# Patient Record
Sex: Male | Born: 1975 | Race: White | Hispanic: No | Marital: Married | Smoking: Never smoker
Health system: Southern US, Community
[De-identification: ages and names within clinical notes are randomized; demographics above are authoritative.]

## PROBLEM LIST (undated history)

## (undated) DIAGNOSIS — B001 Herpesviral vesicular dermatitis: Secondary | ICD-10-CM

## (undated) DIAGNOSIS — K219 Gastro-esophageal reflux disease without esophagitis: Secondary | ICD-10-CM

## (undated) DIAGNOSIS — T7840XA Allergy, unspecified, initial encounter: Secondary | ICD-10-CM

## (undated) DIAGNOSIS — K4090 Unilateral inguinal hernia, without obstruction or gangrene, not specified as recurrent: Secondary | ICD-10-CM

## (undated) DIAGNOSIS — J189 Pneumonia, unspecified organism: Secondary | ICD-10-CM

## (undated) DIAGNOSIS — C801 Malignant (primary) neoplasm, unspecified: Secondary | ICD-10-CM

## (undated) HISTORY — DX: Herpesviral vesicular dermatitis: B00.1

## (undated) HISTORY — DX: Allergy, unspecified, initial encounter: T78.40XA

---

## 2005-03-01 ENCOUNTER — Emergency Department: Payer: Self-pay | Admitting: Emergency Medicine

## 2006-04-06 HISTORY — PX: COLONOSCOPY: SHX174

## 2006-11-16 ENCOUNTER — Ambulatory Visit: Payer: Self-pay | Admitting: Family Medicine

## 2006-12-15 ENCOUNTER — Ambulatory Visit: Payer: Self-pay | Admitting: Gastroenterology

## 2008-05-03 IMAGING — CR DG CHEST 2V
1 series · 2 of 2 positions shown · non-contrast
Comparison: none

REASON FOR EXAM: Shortness of breath, pneumonia
COMMENTS:

[Series 1: view not recorded · 0.17mm/px · 2 of 2 slices shown]
[im 1/2]
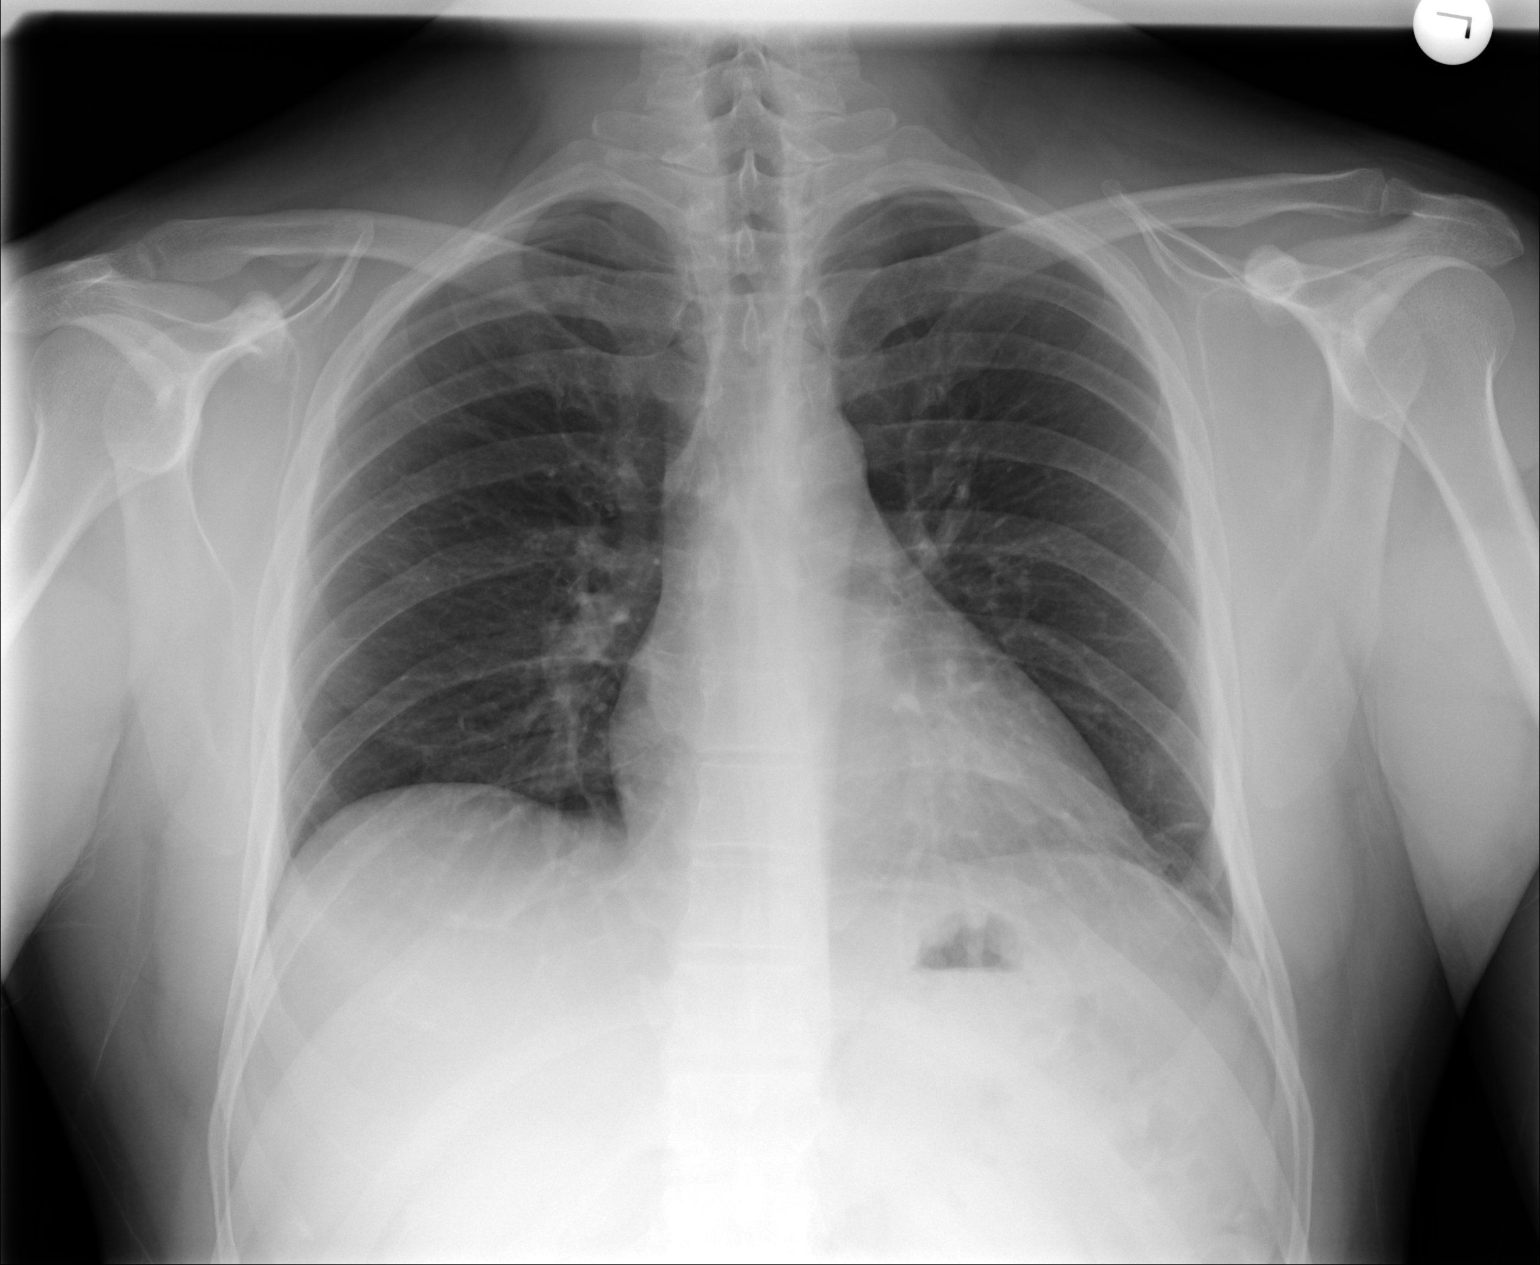
[im 2/2]
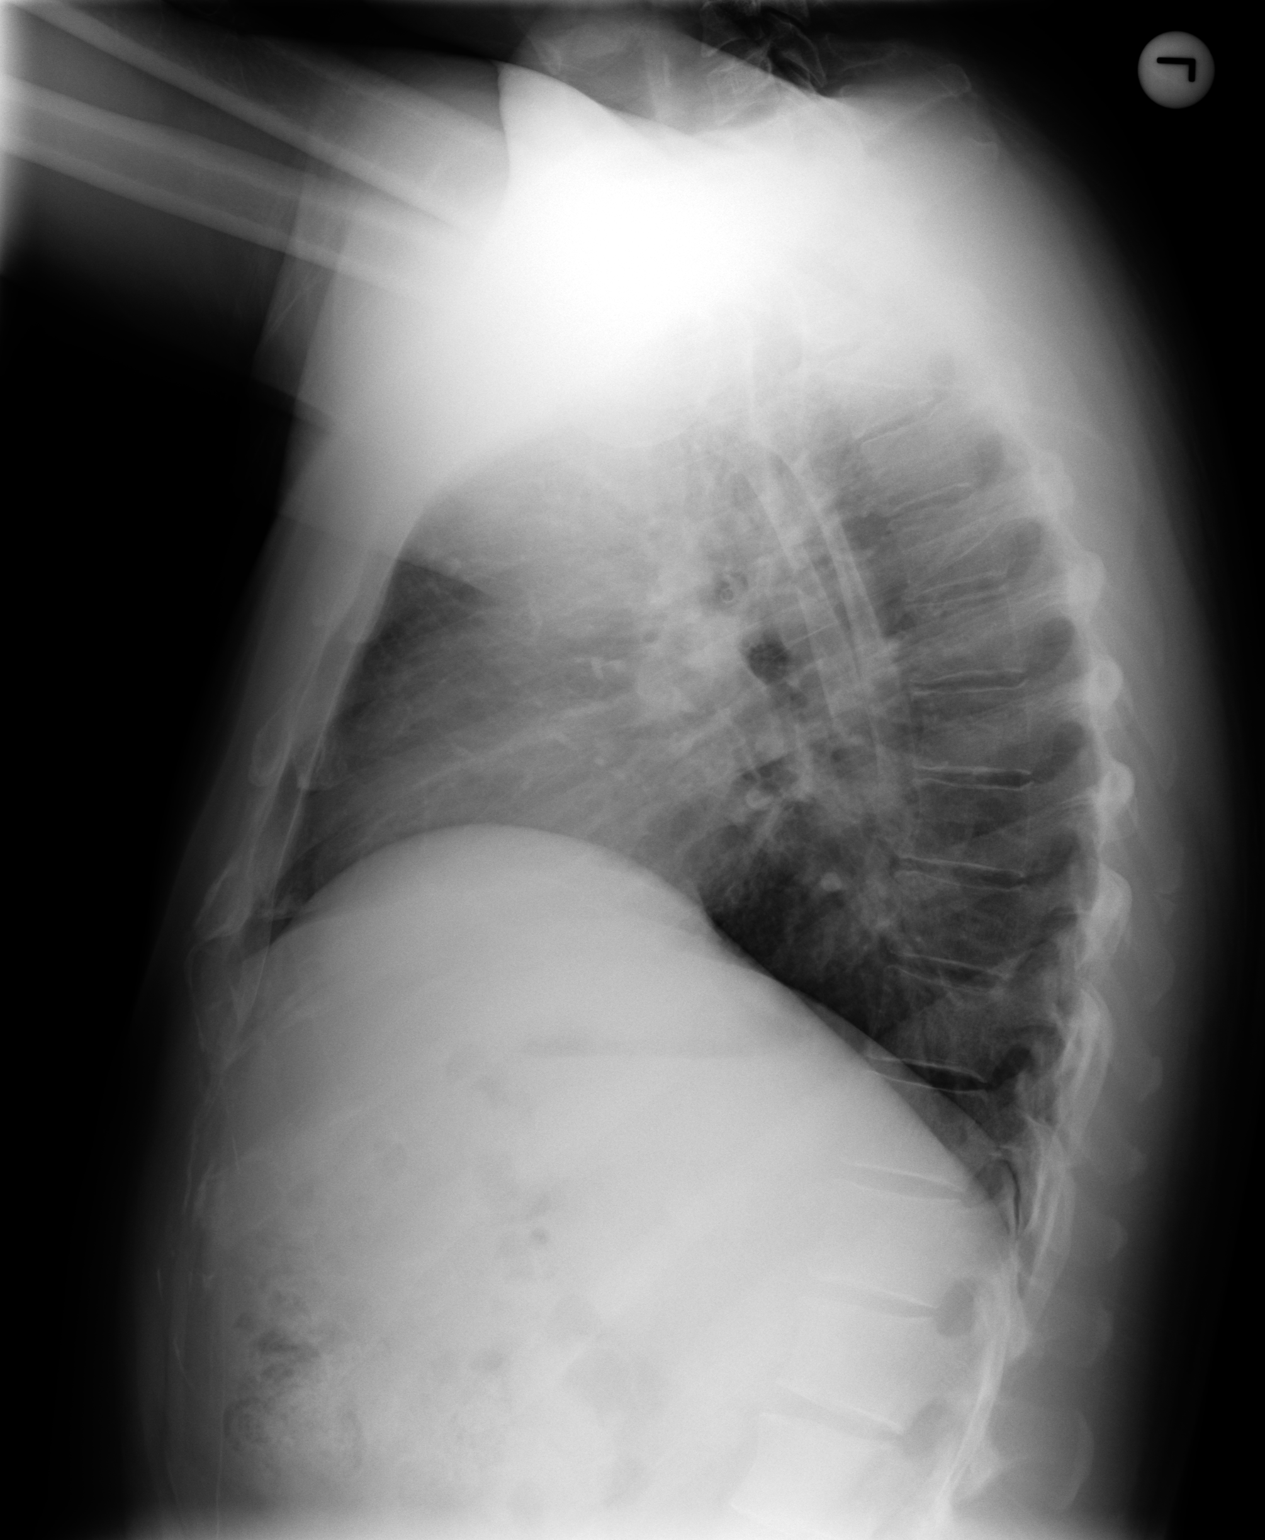

[2 of 2 positions shown; findings below may reference images not displayed]

PROCEDURE:     MDR - MDR CHEST PA(OR AP) AND LATERAL  - November 16, 2006  [DATE]

RESULT:     Comparison is made to a prior exam of 03/01/2005.

There is a linear density at the LEFT base just above the costophrenic
angle. The density is consistent with a fibrotic strand. The lung fields are
clear of infiltrate. The RIGHT lower lobe pneumonia, present on the prior
exam of 03/01/2005, has cleared with no residual evident. The heart,
mediastinal and osseous structures are normal in appearance.
IMPRESSION: No significant abnormalities are noted.

## 2012-03-22 ENCOUNTER — Ambulatory Visit: Payer: Self-pay | Admitting: Family Medicine

## 2013-09-07 IMAGING — CR DG CHEST 2V
1 series · 3 of 3 positions shown · non-contrast
Comparison: none

REASON FOR EXAM: congestion; hx pneumonia
COMMENTS:

[Series 1: pa · 0.17mm/px · 3 of 3 slices shown]
[im 1/3]
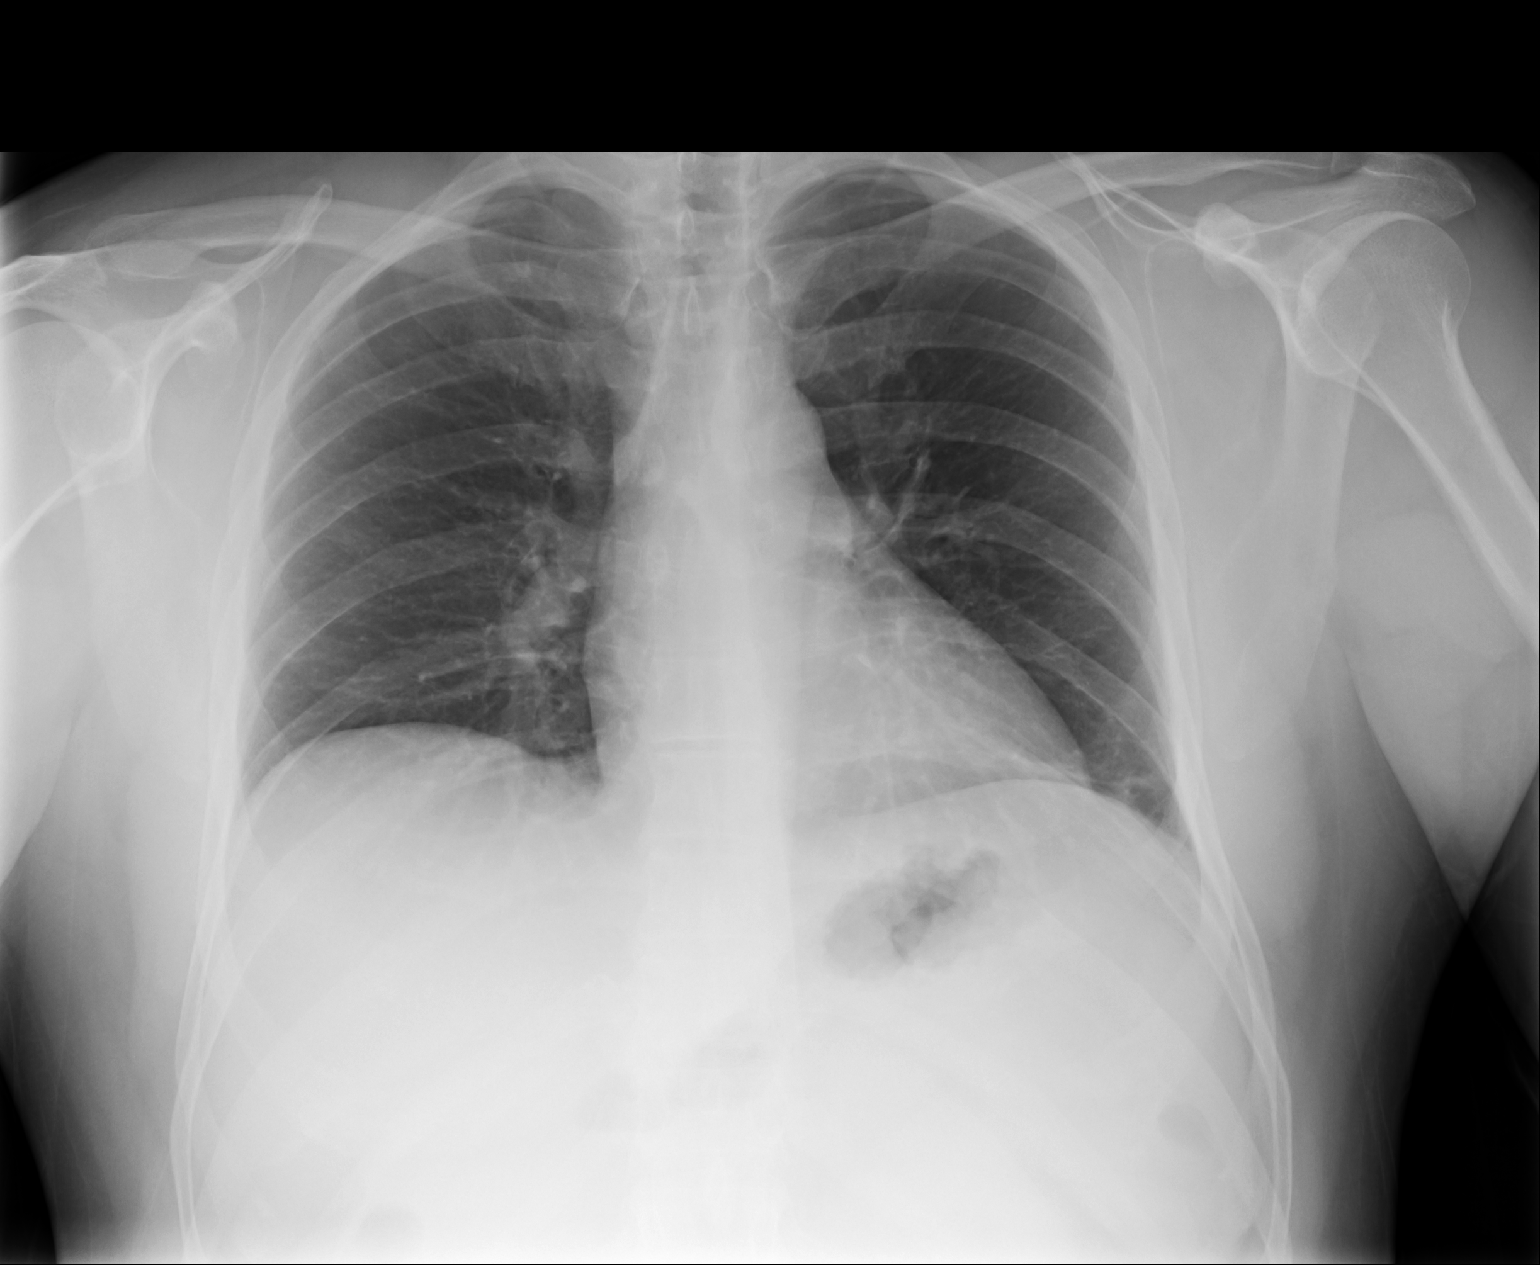
[im 2/3]
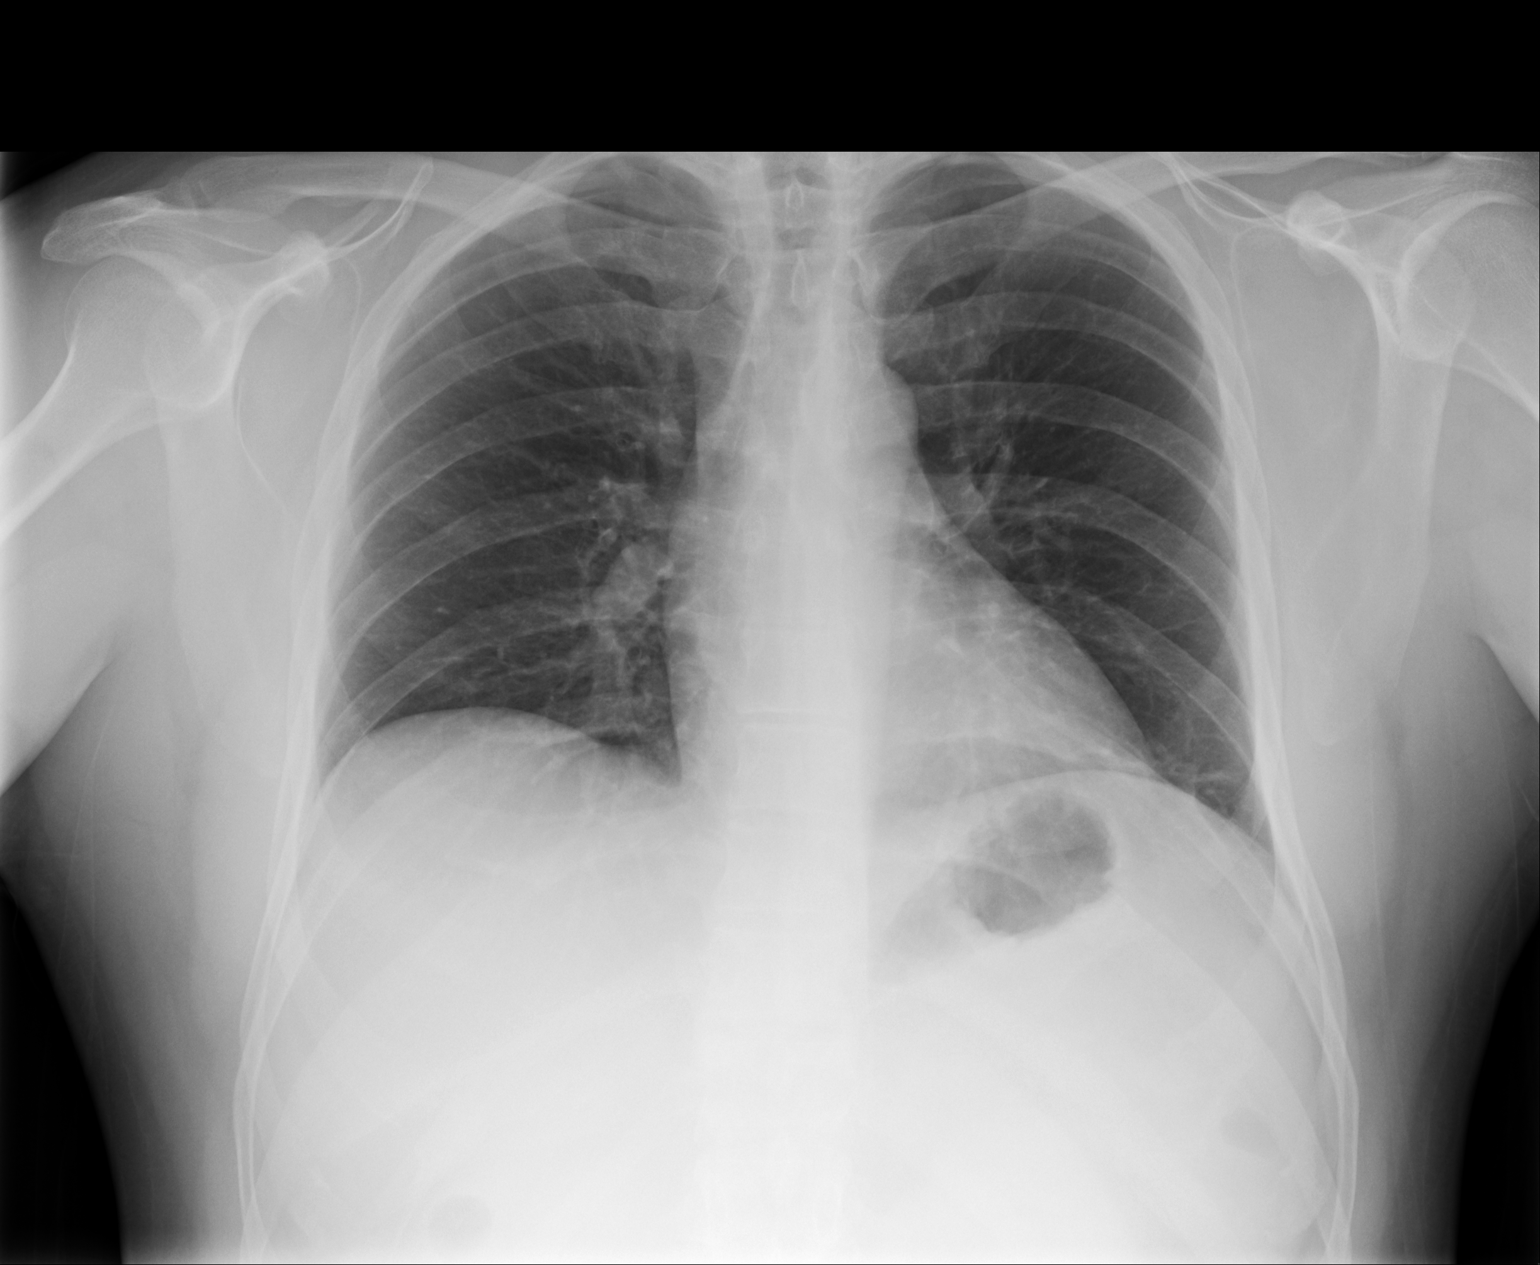
[im 3/3]
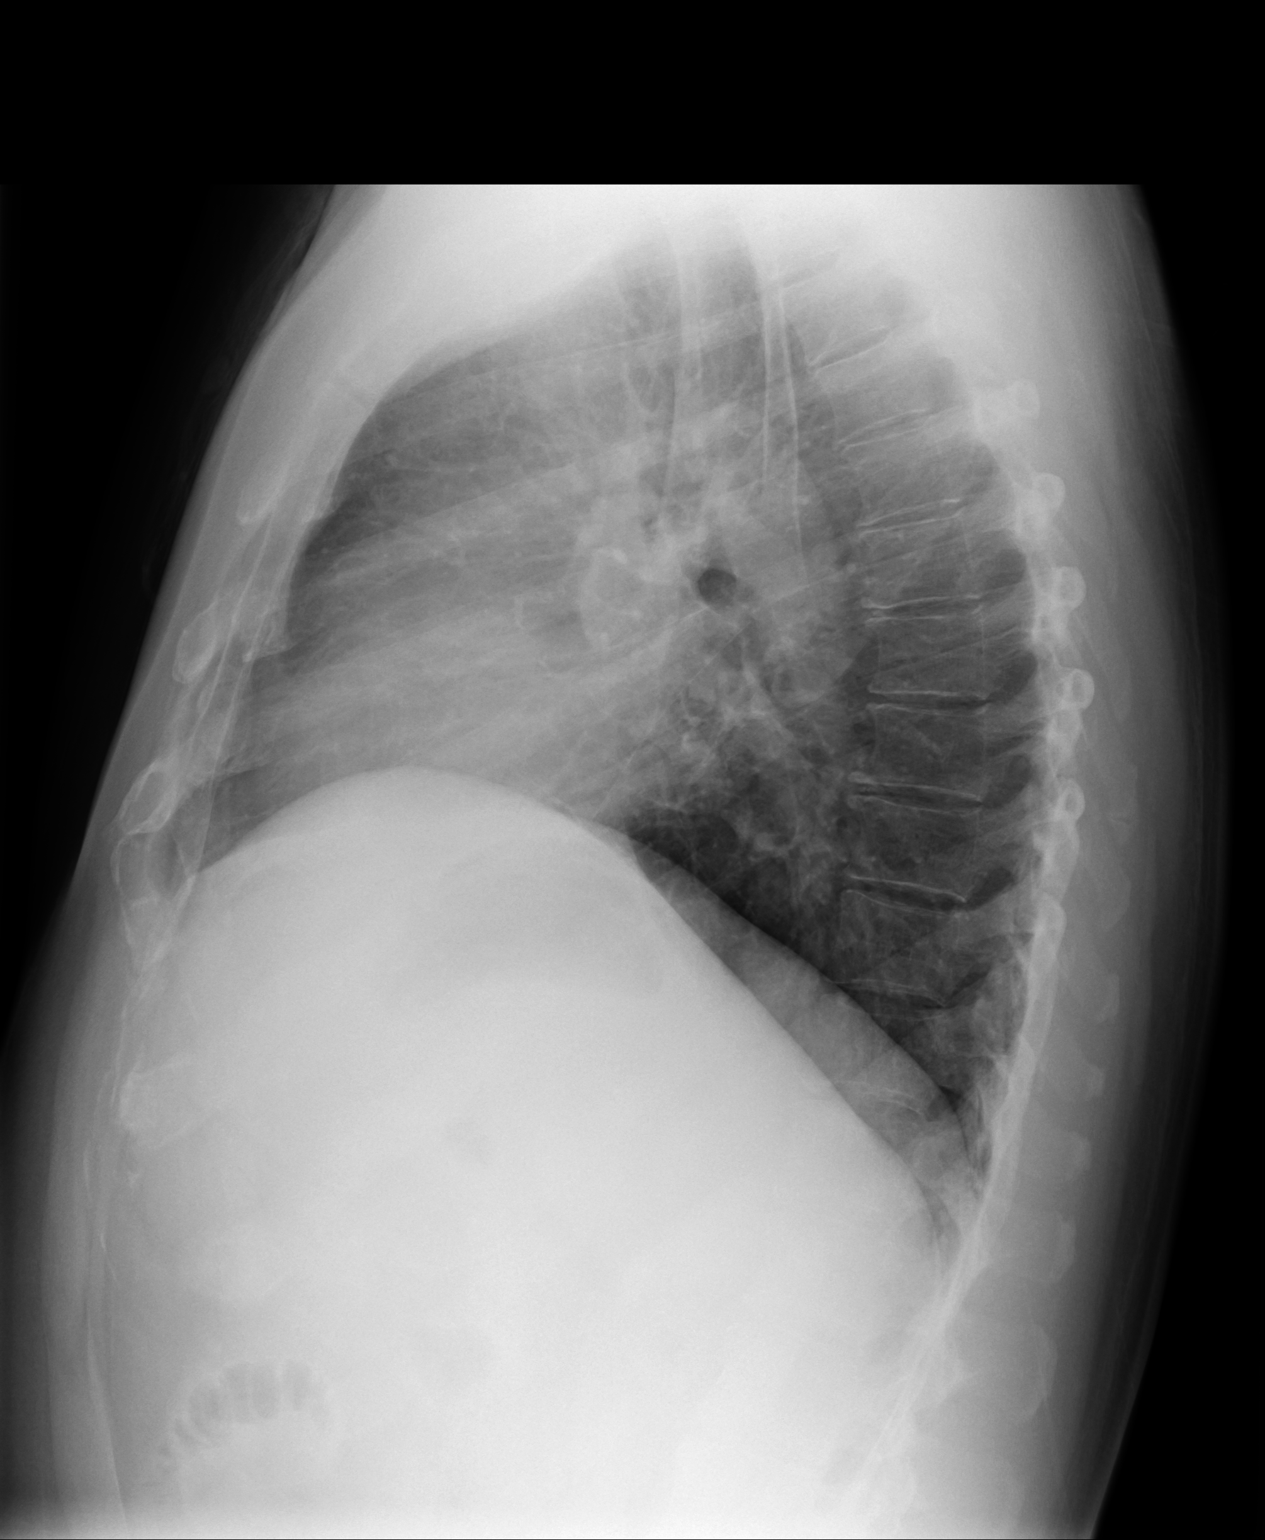

[3 of 3 positions shown; findings below may reference images not displayed]

PROCEDURE:     MDR - MDR CHEST PA(OR AP) AND LATERAL  - March 22, 2012  [DATE]

RESULT:     There is hypoinflation with minimal linear atelectasis minimal
left costophrenic angle. The lungs are otherwise clear. Cardiac silhouette
and pulmonary vasculature appear to be normal. The bony and mediastinal
structures are unremarkable.
IMPRESSION: 1. Hypoinflation. No acute cardiopulmonary disease evident.

[REDACTED]

## 2013-09-09 ENCOUNTER — Ambulatory Visit: Payer: Self-pay | Admitting: Family Medicine

## 2014-11-08 ENCOUNTER — Ambulatory Visit (INDEPENDENT_AMBULATORY_CARE_PROVIDER_SITE_OTHER): Payer: No Typology Code available for payment source | Admitting: Family Medicine

## 2014-11-08 ENCOUNTER — Encounter: Payer: Self-pay | Admitting: Family Medicine

## 2014-11-08 VITALS — BP 110/62 | HR 60 | Ht 68.0 in | Wt 194.0 lb

## 2014-11-08 DIAGNOSIS — J0100 Acute maxillary sinusitis, unspecified: Secondary | ICD-10-CM

## 2014-11-08 MED ORDER — AMOXICILLIN 500 MG PO CAPS: 500.0000 mg | ORAL_CAPSULE | Freq: Three times a day (TID) | ORAL | Status: DC

## 2014-11-08 NOTE — Progress Notes (Signed)
Name: Dustin Morse.   MRN: [id number]    DOB: 1975-05-29   Date:11/08/2014       Progress Note  Subjective  Chief Complaint  Chief Complaint  Patient presents with  . Sinusitis    Sinusitis This is a recurrent problem. The current episode started yesterday. The problem has been waxing and waning since onset. There has been no fever. The fever has been present for 1 to 2 days. Associated symptoms include congestion, sinus pressure and a sore throat. Pertinent negatives include no chills, coughing, diaphoresis, ear pain, headaches, hoarse voice, neck pain, shortness of breath, sneezing or swollen glands. Past treatments include nothing. The treatment provided no relief.    No problem-specific assessment & plan notes found for this encounter.   History reviewed. No pertinent past medical history.  Past Surgical History  Procedure Laterality Date  . Colonoscopy  2008    cleared for 10 yrs- Dr Servando Snare    History reviewed. No pertinent family history.  History   Social History  . Marital Status: Married    Spouse Name: N/A  . Number of Children: N/A  . Years of Education: N/A   Occupational History  . Not on file.   Social History Main Topics  . Smoking status: Never Smoker   . Smokeless tobacco: Not on file  . Alcohol Use: 0.0 oz/week    0 Standard drinks or equivalent per week  . Drug Use: No  . Sexual Activity: Yes   Other Topics Concern  . Not on file   Social History Narrative  . No narrative on file    No Known Allergies   Review of Systems  Constitutional: Negative for fever, chills, weight loss, malaise/fatigue and diaphoresis.  HENT: Positive for congestion, sinus pressure and sore throat. Negative for ear discharge, ear pain, hoarse voice and sneezing.   Eyes: Negative for blurred vision.  Respiratory: Negative for cough, sputum production, shortness of breath and wheezing.   Cardiovascular: Negative for chest pain, palpitations and leg swelling.   Gastrointestinal: Negative for heartburn, nausea, abdominal pain, diarrhea, constipation, blood in stool and melena.  Genitourinary: Negative for dysuria, urgency, frequency and hematuria.  Musculoskeletal: Negative for myalgias, back pain, joint pain and neck pain.  Skin: Negative for rash.  Neurological: Negative for dizziness, tingling, sensory change, focal weakness and headaches.  Endo/Heme/Allergies: Negative for environmental allergies and polydipsia. Does not bruise/bleed easily.  Psychiatric/Behavioral: Negative for depression and suicidal ideas. The patient is not nervous/anxious and does not have insomnia.      Objective  Filed Vitals:   11/08/14 0828  BP: 110/62  Pulse: 60  Height:  (1.727 m)  Weight: 194 lb (87.998 kg)    Physical Exam  Constitutional: He is oriented to person, place, and time and well-developed, well-nourished, and in no distress.  HENT:  Head: Normocephalic.  Right Ear: External ear normal.  Left Ear: External ear normal.  Nose: Nose normal.  Mouth/Throat: Oropharynx is clear and moist.  Eyes: Conjunctivae and EOM are normal. Pupils are equal, round, and reactive to light. Right eye exhibits no discharge. Left eye exhibits no discharge. No scleral icterus.  Neck: Normal range of motion. Neck supple. No JVD present. No tracheal deviation present. No thyromegaly present.  Cardiovascular: Normal rate, regular rhythm, normal heart sounds and intact distal pulses.  Exam reveals no gallop and no friction rub.   No murmur heard. Pulmonary/Chest: Breath sounds normal. No respiratory distress. He has no wheezes. He has no  rales.  Abdominal: Soft. Bowel sounds are normal. He exhibits no mass. There is no hepatosplenomegaly. There is no tenderness. There is no rebound, no guarding and no CVA tenderness.  Musculoskeletal: Normal range of motion. He exhibits no edema or tenderness.  Lymphadenopathy:    He has no cervical adenopathy.  Neurological: He is  alert and oriented to person, place, and time. He has normal sensation, normal strength, normal reflexes and intact cranial nerves. No cranial nerve deficit.  Skin: Skin is warm. No rash noted.  Psychiatric: Mood and affect normal.  Nursing note and vitals reviewed.     Assessment & Plan  Problem List Items Addressed This Visit    None    Visit Diagnoses    Acute maxillary sinusitis, recurrence not specified    -  Primary    suggest allegra    Relevant Medications    predniSONE (DELTASONE) 10 MG tablet    amoxicillin (AMOXIL) 500 MG capsule         Dr. Hayden [facility]  11/08/2014

## 2015-02-04 ENCOUNTER — Encounter: Payer: Self-pay | Admitting: Family Medicine

## 2015-02-04 ENCOUNTER — Ambulatory Visit (INDEPENDENT_AMBULATORY_CARE_PROVIDER_SITE_OTHER): Payer: BLUE CROSS/BLUE SHIELD | Admitting: Family Medicine

## 2015-02-04 VITALS — BP 120/70 | HR 64 | Ht 68.0 in | Wt 197.0 lb

## 2015-02-04 DIAGNOSIS — J0100 Acute maxillary sinusitis, unspecified: Secondary | ICD-10-CM | POA: Diagnosis not present

## 2015-02-04 DIAGNOSIS — K1379 Other lesions of oral mucosa: Secondary | ICD-10-CM | POA: Diagnosis not present

## 2015-02-04 MED ORDER — AMOXICILLIN 500 MG PO CAPS: 500.0000 mg | ORAL_CAPSULE | Freq: Three times a day (TID) | ORAL | Status: DC

## 2015-02-04 MED ORDER — PREDNISONE 10 MG PO TABS: ORAL_TABLET | ORAL | Status: DC

## 2015-02-04 NOTE — Progress Notes (Signed)
Name: Dustin Morse.   MRN: [id number]    DOB: 06/22/1975   Date:02/04/2015       Progress Note  Subjective  Chief Complaint  Chief Complaint  Patient presents with  . Sinusitis    otc claritin and sinus med- yellow prod, stuffy nose  . Mouth Lesions    needs refill on prednisone     Sinusitis This is a new problem. The current episode started in the past 7 days. The problem has been waxing and waning since onset. There has been no fever. The fever has been present for 1 to 2 days. The pain is mild. Associated symptoms include congestion, headaches, sinus pressure, sneezing, a sore throat and swollen glands. Pertinent negatives include no chills, coughing, diaphoresis, ear pain, hoarse voice, neck pain or shortness of breath. Past treatments include acetaminophen (claritin). The treatment provided moderate relief.  Mouth Lesions  Associated symptoms include congestion, headaches, mouth sores, sore throat and swollen glands. Pertinent negatives include no fever, no abdominal pain, no constipation, no diarrhea, no nausea, no ear discharge, no ear pain, no neck pain, no cough, no wheezing and no rash.    No problem-specific assessment & plan notes found for this encounter.   Past Medical History  Diagnosis Date  . Recurrent cold sores   . Allergy     Past Surgical History  Procedure Laterality Date  . Colonoscopy  2008    cleared for 10 yrs- Dr Servando Snare    History reviewed. No pertinent family history.  Social History   Social History  . Marital Status: Married    Spouse Name: N/A  . Number of Children: N/A  . Years of Education: N/A   Occupational History  . Not on file.   Social History Main Topics  . Smoking status: Never Smoker   . Smokeless tobacco: Not on file  . Alcohol Use: 0.0 oz/week    0 Standard drinks or equivalent per week  . Drug Use: No  . Sexual Activity: Yes   Other Topics Concern  . Not on file   Social History Narrative    No Known  Allergies   Review of Systems  Constitutional: Negative for fever, chills, weight loss, malaise/fatigue and diaphoresis.  HENT: Positive for congestion, mouth sores, sinus pressure, sneezing and sore throat. Negative for ear discharge, ear pain and hoarse voice.   Eyes: Negative for blurred vision.  Respiratory: Negative for cough, sputum production, shortness of breath and wheezing.   Cardiovascular: Negative for chest pain, palpitations and leg swelling.  Gastrointestinal: Negative for heartburn, nausea, abdominal pain, diarrhea, constipation, blood in stool and melena.  Genitourinary: Negative for dysuria, urgency, frequency and hematuria.  Musculoskeletal: Negative for myalgias, back pain, joint pain and neck pain.  Skin: Negative for rash.  Neurological: Positive for headaches. Negative for dizziness, tingling, sensory change and focal weakness.  Endo/Heme/Allergies: Negative for environmental allergies and polydipsia. Does not bruise/bleed easily.  Psychiatric/Behavioral: Negative for depression and suicidal ideas. The patient is not nervous/anxious and does not have insomnia.      Objective  Filed Vitals:   02/04/15 1356  BP: 120/70  Pulse: 64  Height:  (1.727 m)  Weight: 197 lb (89.359 kg)    Physical Exam  Constitutional: He is oriented to person, place, and time and well-developed, well-nourished, and in no distress.  HENT:  Head: Normocephalic.  Right Ear: External ear normal.  Left Ear: External ear normal.  Nose: Nose normal.  Mouth/Throat: Oropharynx is clear  and moist.  Eyes: Conjunctivae and EOM are normal. Pupils are equal, round, and reactive to light. Right eye exhibits no discharge. Left eye exhibits no discharge. No scleral icterus.  Neck: Normal range of motion. Neck supple. No JVD present. No tracheal deviation present. No thyromegaly present.  Cardiovascular: Normal rate, regular rhythm, normal heart sounds and intact distal pulses.  Exam reveals no  gallop and no friction rub.   No murmur heard. Pulmonary/Chest: Breath sounds normal. No respiratory distress. He has no wheezes. He has no rales.  Abdominal: Soft. Bowel sounds are normal. He exhibits no mass. There is no hepatosplenomegaly. There is no tenderness. There is no rebound, no guarding and no CVA tenderness.  Musculoskeletal: Normal range of motion. He exhibits no edema or tenderness.  Lymphadenopathy:    He has no cervical adenopathy.  Neurological: He is alert and oriented to person, place, and time. He has normal sensation, normal strength, normal reflexes and intact cranial nerves. No cranial nerve deficit.  Skin: Skin is warm. No rash noted.  Psychiatric: Mood and affect normal.  Nursing note and vitals reviewed.     Assessment & Plan  Problem List Items Addressed This Visit    None    Visit Diagnoses    Acute maxillary sinusitis, recurrence not specified    -  Primary    Relevant Medications    amoxicillin (AMOXIL) 500 MG capsule    predniSONE (DELTASONE) 10 MG tablet    Recurrent mouth ulceration        Relevant Medications    predniSONE (DELTASONE) 10 MG tablet         Dr. Hayden Rasmusseneanna Jones [facility]  02/04/2015

## 2015-02-25 IMAGING — CR RIGHT FOOT COMPLETE - 3+ VIEW
1 series · 1 of 1 positions shown · non-contrast
Comparison: None.

CLINICAL DATA: Pain post trauma

EXAM:
RIGHT FOOT COMPLETE - 3+ VIEW

[ap]
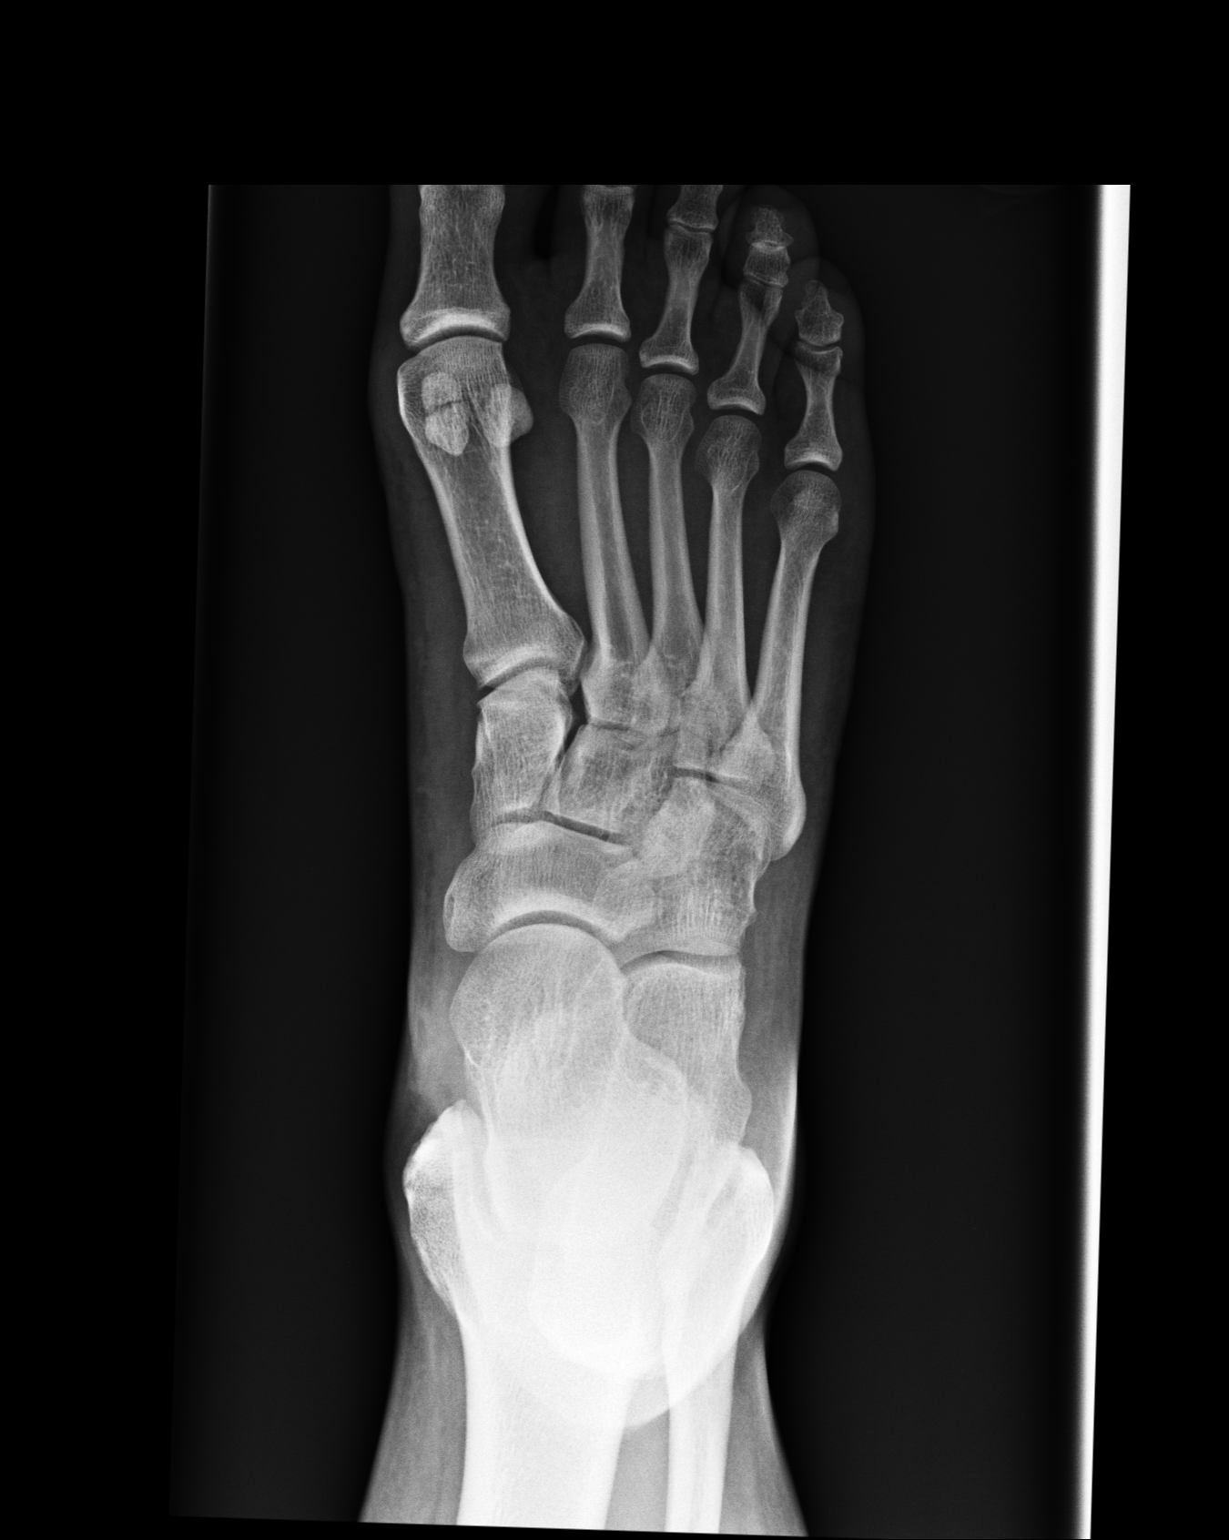

[1 of 1 positions shown; findings below may reference images not displayed]

FINDINGS: Frontal, oblique, and lateral views were obtained. There is a
transversely oriented fracture of a sesamoid bone volar to the
distal first metatarsal. No other fracture. No dislocation. Joint
spaces appear intact. There is a minimal spur arising from the
posterior calcaneus.
IMPRESSION: Evidence of a fracture of a sesamoid volar to the first metatarsal
distally in near anatomic alignment. No other fracture. No
dislocation. Joint spaces appear intact.

## 2015-03-25 ENCOUNTER — Ambulatory Visit (INDEPENDENT_AMBULATORY_CARE_PROVIDER_SITE_OTHER): Payer: BLUE CROSS/BLUE SHIELD | Admitting: Family Medicine

## 2015-03-25 ENCOUNTER — Encounter: Payer: Self-pay | Admitting: Family Medicine

## 2015-03-25 VITALS — BP 120/80 | HR 64 | Temp 98.0°F | Ht 68.0 in | Wt 200.0 lb

## 2015-03-25 DIAGNOSIS — J0110 Acute frontal sinusitis, unspecified: Secondary | ICD-10-CM

## 2015-03-25 MED ORDER — GUAIFENESIN-CODEINE 100-10 MG/5ML PO SOLN: 5.0000 mL | Freq: Three times a day (TID) | ORAL | Status: DC | PRN

## 2015-03-25 MED ORDER — AZITHROMYCIN 250 MG PO TABS: ORAL_TABLET | ORAL | Status: DC

## 2015-03-25 NOTE — Progress Notes (Signed)
Name: Dustin Morse.   MRN: [id number]    DOB: March 30, 1976   Date:03/25/2015       Progress Note  Subjective  Chief Complaint  Chief Complaint  Patient presents with  . Sinusitis    cough and cong- slightly productive cough- goes from being clear to yellow to green    Sinusitis This is a recurrent problem. The current episode started in the past 7 days. The problem has been gradually worsening since onset. There has been no fever. He is experiencing no pain. Associated symptoms include congestion, coughing, diaphoresis, headaches and sinus pressure. Pertinent negatives include no chills, ear pain, hoarse voice, neck pain, shortness of breath, sneezing, sore throat or swollen glands. Past treatments include nothing. The treatment provided mild relief.  Cough This is a new problem. The current episode started in the past 7 days. The problem has been waxing and waning. The cough is productive of purulent sputum. Associated symptoms include headaches, nasal congestion and postnasal drip. Pertinent negatives include no chest pain, chills, ear pain, fever, heartburn, myalgias, rash, sore throat, shortness of breath, weight loss or wheezing. The treatment provided mild relief. There is no history of environmental allergies.    No problem-specific assessment & plan notes found for this encounter.   Past Medical History  Diagnosis Date  . Recurrent cold sores   . Allergy     Past Surgical History  Procedure Laterality Date  . Colonoscopy  2008    cleared for 10 yrs- Dr Servando Snare    History reviewed. No pertinent family history.  Social History   Social History  . Marital Status: Married    Spouse Name: N/A  . Number of Children: N/A  . Years of Education: N/A   Occupational History  . Not on file.   Social History Main Topics  . Smoking status: Never Smoker   . Smokeless tobacco: Not on file  . Alcohol Use: 0.0 oz/week    0 Standard drinks or equivalent per week  . Drug Use:  No  . Sexual Activity: Yes   Other Topics Concern  . Not on file   Social History Narrative    No Known Allergies   Review of Systems  Constitutional: Positive for diaphoresis. Negative for fever, chills, weight loss and malaise/fatigue.  HENT: Positive for congestion, postnasal drip and sinus pressure. Negative for ear discharge, ear pain, hoarse voice, sneezing and sore throat.   Eyes: Negative for blurred vision.  Respiratory: Positive for cough. Negative for sputum production, shortness of breath and wheezing.   Cardiovascular: Negative for chest pain, palpitations and leg swelling.  Gastrointestinal: Negative for heartburn, nausea, abdominal pain, diarrhea, constipation, blood in stool and melena.  Genitourinary: Negative for dysuria, urgency, frequency and hematuria.  Musculoskeletal: Negative for myalgias, back pain, joint pain and neck pain.  Skin: Negative for rash.  Neurological: Positive for headaches. Negative for dizziness, tingling, sensory change and focal weakness.  Endo/Heme/Allergies: Negative for environmental allergies and polydipsia. Does not bruise/bleed easily.  Psychiatric/Behavioral: Negative for depression and suicidal ideas. The patient is not nervous/anxious and does not have insomnia.      Objective  Filed Vitals:   03/25/15 1127  BP: 120/80  Pulse: 64  Temp: 98 F (36.7 C)  Height:  (1.727 m)  Weight: 200 lb (90.719 kg)    Physical Exam  Constitutional: He is oriented to person, place, and time and well-developed, well-nourished, and in no distress.  HENT:  Head: Normocephalic.  Right Ear:  External ear normal.  Left Ear: External ear normal.  Nose: Nose normal.  Mouth/Throat: Oropharynx is clear and moist.  Eyes: Conjunctivae and EOM are normal. Pupils are equal, round, and reactive to light. Right eye exhibits no discharge. Left eye exhibits no discharge. No scleral icterus.  Neck: Normal range of motion. Neck supple. No JVD  present. No tracheal deviation present. No thyromegaly present.  Cardiovascular: Normal rate, regular rhythm, normal heart sounds and intact distal pulses.  Exam reveals no gallop and no friction rub.   No murmur heard. Pulmonary/Chest: Breath sounds normal. No respiratory distress. He has no wheezes. He has no rales.  Abdominal: Soft. Bowel sounds are normal. He exhibits no mass. There is no hepatosplenomegaly. There is no tenderness. There is no rebound, no guarding and no CVA tenderness.  Musculoskeletal: Normal range of motion. He exhibits no edema or tenderness.  Lymphadenopathy:    He has no cervical adenopathy.  Neurological: He is alert and oriented to person, place, and time. He has normal sensation, normal strength and intact cranial nerves. No cranial nerve deficit.  Skin: Skin is warm. No rash noted.  Psychiatric: Mood and affect normal.      Assessment & Plan  Problem List Items Addressed This Visit    None    Visit Diagnoses    Acute frontal sinusitis, recurrence not specified    -  Primary    Relevant Medications    azithromycin (ZITHROMAX) 250 MG tablet    guaiFENesin-codeine 100-10 MG/5ML syrup         Dr. Hayden Rasmusseneanna Jones [facility]  03/25/2015

## 2015-09-09 ENCOUNTER — Ambulatory Visit (INDEPENDENT_AMBULATORY_CARE_PROVIDER_SITE_OTHER): Payer: BLUE CROSS/BLUE SHIELD | Admitting: Family Medicine

## 2015-09-09 ENCOUNTER — Encounter: Payer: Self-pay | Admitting: Family Medicine

## 2015-09-09 VITALS — BP 120/88 | HR 78 | Ht 68.0 in | Wt 193.0 lb

## 2015-09-09 DIAGNOSIS — J029 Acute pharyngitis, unspecified: Secondary | ICD-10-CM

## 2015-09-09 DIAGNOSIS — J0100 Acute maxillary sinusitis, unspecified: Secondary | ICD-10-CM

## 2015-09-09 MED ORDER — AMOXICILLIN-POT CLAVULANATE 875-125 MG PO TABS: 1.0000 | ORAL_TABLET | Freq: Two times a day (BID) | ORAL | Status: DC

## 2015-09-09 NOTE — Progress Notes (Signed)
Name: Dustin Morse.   MRN: [id number]    DOB: 02/09/1976   Date:09/09/2015       Progress Note  Subjective  Chief Complaint  Chief Complaint  Patient presents with  . Sinusitis    cong x 2 weeks- taking otc and not helping    Sinusitis This is a new problem. The current episode started in the past 7 days. The problem has been gradually worsening since onset. There has been no fever. He is experiencing no pain. Associated symptoms include congestion, coughing, sinus pressure and a sore throat. Pertinent negatives include no chills, diaphoresis, ear pain, headaches, hoarse voice, neck pain, shortness of breath, sneezing or swollen glands. Past treatments include acetaminophen. The treatment provided mild relief.  Cough This is a new problem. The current episode started in the past 7 days. The problem has been waxing and waning. The cough is productive of purulent sputum. Associated symptoms include a sore throat. Pertinent negatives include no chest pain, chills, ear pain, fever, headaches, heartburn, myalgias, rash, shortness of breath, weight loss or wheezing. Associated symptoms comments: yelolow. There is no history of environmental allergies.    No problem-specific assessment & plan notes found for this encounter.   Past Medical History  Diagnosis Date  . Recurrent cold sores   . Allergy     Past Surgical History  Procedure Laterality Date  . Colonoscopy  2008    cleared for 10 yrs- Dr Servando Snare    History reviewed. No pertinent family history.  Social History   Social History  . Marital Status: Married    Spouse Name: N/A  . Number of Children: N/A  . Years of Education: N/A   Occupational History  . Not on file.   Social History Main Topics  . Smoking status: Never Smoker   . Smokeless tobacco: Not on file  . Alcohol Use: 0.0 oz/week    0 Standard drinks or equivalent per week  . Drug Use: No  . Sexual Activity: Yes   Other Topics Concern  . Not on file    Social History Narrative    No Known Allergies   Review of Systems  Constitutional: Negative for fever, chills, weight loss, malaise/fatigue and diaphoresis.  HENT: Positive for congestion, sinus pressure and sore throat. Negative for ear discharge, ear pain, hoarse voice and sneezing.   Eyes: Negative for blurred vision.  Respiratory: Positive for cough. Negative for sputum production, shortness of breath and wheezing.   Cardiovascular: Negative for chest pain, palpitations and leg swelling.  Gastrointestinal: Negative for heartburn, nausea, abdominal pain, diarrhea, constipation, blood in stool and melena.  Genitourinary: Negative for dysuria, urgency, frequency and hematuria.  Musculoskeletal: Negative for myalgias, back pain, joint pain and neck pain.  Skin: Negative for rash.  Neurological: Negative for dizziness, tingling, sensory change, focal weakness and headaches.  Endo/Heme/Allergies: Negative for environmental allergies and polydipsia. Does not bruise/bleed easily.  Psychiatric/Behavioral: Negative for depression and suicidal ideas. The patient is not nervous/anxious and does not have insomnia.      Objective  Filed Vitals:   09/09/15 1039  BP: 120/88  Pulse: 78  Height:  (1.727 m)  Weight: 193 lb (87.544 kg)    Physical Exam  Constitutional: He is oriented to person, place, and time and well-developed, well-nourished, and in no distress.  HENT:  Head: Normocephalic.  Right Ear: External ear normal.  Left Ear: External ear normal.  Nose: Nose normal.  Mouth/Throat: Oropharynx is clear and moist.  Eyes: Conjunctivae and EOM are normal. Pupils are equal, round, and reactive to light. Right eye exhibits no discharge. Left eye exhibits no discharge. No scleral icterus.  Neck: Normal range of motion. Neck supple. No JVD present. No tracheal deviation present. No thyromegaly present.  Cardiovascular: Normal rate, regular rhythm, normal heart sounds and intact  distal pulses.  Exam reveals no gallop and no friction rub.   No murmur heard. Pulmonary/Chest: Breath sounds normal. No respiratory distress. He has no wheezes. He has no rales.  Abdominal: Soft. Bowel sounds are normal. He exhibits no mass. There is no hepatosplenomegaly. There is no tenderness. There is no rebound, no guarding and no CVA tenderness.  Musculoskeletal: Normal range of motion. He exhibits no edema or tenderness.  Lymphadenopathy:    He has no cervical adenopathy.  Neurological: He is alert and oriented to person, place, and time. He has normal sensation, normal strength, normal reflexes and intact cranial nerves. No cranial nerve deficit.  Skin: Skin is warm. No rash noted.  Psychiatric: Mood and affect normal.  Nursing note and vitals reviewed.     Assessment & Plan  Problem List Items Addressed This Visit    None    Visit Diagnoses    Acute maxillary sinusitis, recurrence not specified    -  Primary    Relevant Medications    amoxicillin-clavulanate (AUGMENTIN) 875-125 MG tablet    Pharyngitis        Relevant Medications    amoxicillin-clavulanate (AUGMENTIN) 875-125 MG tablet         Dr. Hayden Rasmusseneanna Jones [facility]  09/09/2015

## 2016-02-10 ENCOUNTER — Other Ambulatory Visit: Payer: Self-pay

## 2016-02-11 ENCOUNTER — Ambulatory Visit (INDEPENDENT_AMBULATORY_CARE_PROVIDER_SITE_OTHER): Payer: BLUE CROSS/BLUE SHIELD | Admitting: Family Medicine

## 2016-02-11 ENCOUNTER — Encounter: Payer: Self-pay | Admitting: Family Medicine

## 2016-02-11 VITALS — BP 110/70 | HR 80 | Ht 68.0 in | Wt 192.0 lb

## 2016-02-11 DIAGNOSIS — B001 Herpesviral vesicular dermatitis: Secondary | ICD-10-CM

## 2016-02-11 MED ORDER — PREDNISONE 10 MG PO TABS: 10.0000 mg | ORAL_TABLET | Freq: Every day | ORAL | 0 refills | Status: DC

## 2016-02-11 NOTE — Progress Notes (Signed)
Name: Dustin MaidensRichard P Lykens Jr.   MRN: [id number]    DOB: [birth year]   Date:02/11/2016       Progress Note  Subjective  Chief Complaint  Chief Complaint  Patient presents with  . Mouth Lesions    needs pred called in    Mouth Lesions   The current episode started 3 to 5 days ago. The onset was gradual. The problem occurs occasionally. The problem has been gradually improving. The problem is mild. The symptoms are relieved by one or more prescription drugs. Nothing aggravates the symptoms. Associated symptoms include mouth sores. Pertinent negatives include no fever, no decreased vision, no double vision, no eye itching, no photophobia, no abdominal pain, no constipation, no diarrhea, no nausea, no congestion, no ear discharge, no ear pain, no headaches, no hearing loss, no rhinorrhea, no sore throat, no stridor, no swollen glands, no neck pain, no cough, no wheezing, no rash, no eye discharge, no eye pain and no eye redness.    No problem-specific Assessment & Plan notes found for this encounter.   Past Medical History:  Diagnosis Date  . Allergy   . Recurrent cold sores     Past Surgical History:  Procedure Laterality Date  . COLONOSCOPY  2008   cleared for 10 yrs- Dr Servando SnareWohl    History reviewed. No pertinent family history.  Social History   Social History  . Marital status: Married    Spouse name: Dustin Morse  . Number of children: Dustin Morse  . Years of education: Dustin Morse   Occupational History  . Not on file.   Social History Main Topics  . Smoking status: Never Smoker  . Smokeless tobacco: Not on file  . Alcohol use 0.0 oz/week  . Drug use: No  . Sexual activity: Yes   Other Topics Concern  . Not on file   Social History Narrative  . No narrative on file    No Known Allergies   Review of Systems  Constitutional: Negative for chills, fever, malaise/fatigue and weight loss.  HENT: Positive for mouth sores. Negative for congestion, ear discharge, ear pain, hearing loss,  rhinorrhea and sore throat.   Eyes: Negative for blurred vision, double vision, photophobia, pain, discharge, redness and itching.  Respiratory: Negative for cough, sputum production, shortness of breath, wheezing and stridor.   Cardiovascular: Negative for chest pain, palpitations and leg swelling.  Gastrointestinal: Negative for abdominal pain, blood in stool, constipation, diarrhea, heartburn, melena and nausea.  Genitourinary: Negative for dysuria, frequency, hematuria and urgency.  Musculoskeletal: Negative for back pain, joint pain, myalgias and neck pain.  Skin: Negative for rash.  Neurological: Negative for dizziness, tingling, sensory change, focal weakness and headaches.  Endo/Heme/Allergies: Negative for environmental allergies and polydipsia. Does not bruise/bleed easily.  Psychiatric/Behavioral: Negative for depression and suicidal ideas. The patient is not nervous/anxious and does not have insomnia.      Objective  Vitals:   02/11/16 0800  BP: 110/70  Pulse: 80  Weight: 192 lb (87.1 kg)  Height: 5\' 8"  (1.727 m)    Physical Exam  Constitutional: He is oriented to person, place, and time and well-developed, well-nourished, and in no distress.  HENT:  Head: Normocephalic.  Right Ear: External ear normal.  Left Ear: External ear normal.  Nose: Nose normal.  Mouth/Throat: Oropharynx is clear and moist.  Eyes: Conjunctivae and EOM are normal. Pupils are equal, round, and reactive to light. Right eye exhibits no discharge. Left eye exhibits no discharge. No scleral icterus.  Neck: Normal  range of motion. Neck supple. No JVD present. No tracheal deviation present. No thyromegaly present.  Cardiovascular: Normal rate, regular rhythm, normal heart sounds and intact distal pulses.  Exam reveals no gallop and no friction rub.   No murmur heard. Pulmonary/Chest: Breath sounds normal. No respiratory distress. He has no wheezes. He has no rales.  Abdominal: Soft. Bowel sounds are  normal. He exhibits no mass. There is no hepatosplenomegaly. There is no tenderness. There is no rebound, no guarding and no CVA tenderness.  Musculoskeletal: Normal range of motion. He exhibits no edema or tenderness.  Lymphadenopathy:    He has no cervical adenopathy.  Neurological: He is alert and oriented to person, place, and time. He has normal sensation, normal strength, normal reflexes and intact cranial nerves. No cranial nerve deficit.  Skin: Skin is warm. No rash noted.  Psychiatric: Mood and affect normal.  Nursing note and vitals reviewed.     Assessment & Plan  Problem List Items Addressed This Visit      Digestive   Recurrent cold sores - Primary   Relevant Medications   predniSONE (DELTASONE) 10 MG tablet        Dr. Hayden Rasmusseneanna Mickey Esguerra [facility]  02/11/16

## 2016-09-07 ENCOUNTER — Other Ambulatory Visit: Payer: Self-pay

## 2016-09-07 DIAGNOSIS — B001 Herpesviral vesicular dermatitis: Secondary | ICD-10-CM

## 2016-09-07 MED ORDER — PREDNISONE 10 MG PO TABS: 10.0000 mg | ORAL_TABLET | Freq: Every day | ORAL | 0 refills | Status: DC

## 2017-04-05 ENCOUNTER — Encounter: Payer: Self-pay | Admitting: Family Medicine

## 2017-04-05 ENCOUNTER — Ambulatory Visit: Payer: 59 | Admitting: Family Medicine

## 2017-04-05 VITALS — BP 118/80 | HR 78 | Resp 16 | Ht 68.0 in | Wt 189.0 lb

## 2017-04-05 DIAGNOSIS — J0100 Acute maxillary sinusitis, unspecified: Secondary | ICD-10-CM | POA: Diagnosis not present

## 2017-04-05 MED ORDER — AMOXICILLIN-POT CLAVULANATE 875-125 MG PO TABS: 1.0000 | ORAL_TABLET | Freq: Two times a day (BID) | ORAL | 0 refills | Status: DC

## 2017-04-05 NOTE — Progress Notes (Signed)
Name: Dustin MaidensRichard P Mccorkel Jr.   MRN: [id number]    DOB: May 28, 1975   Date:04/05/2017       Progress Note  Subjective  Chief Complaint  Chief Complaint  Patient presents with  . Sinusitis    yesterday started feeling like he has sinusitis. Had sore throat 2 weeks ago and  now having sinus issues. Runny nose.Sinus pressure...headache...denies bodyache and fever.     Sinusitis  This is a new problem. The current episode started in the past 7 days. There has been no fever. The pain is mild. Associated symptoms include congestion and sinus pressure. Pertinent negatives include no chills, coughing, diaphoresis, ear pain, headaches, hoarse voice, neck pain, shortness of breath, sneezing, sore throat or swollen glands. Past treatments include acetaminophen. The treatment provided mild relief.    No problem-specific Assessment & Plan notes found for this encounter.   Past Medical History:  Diagnosis Date  . Allergy   . Recurrent cold sores     Past Surgical History:  Procedure Laterality Date  . COLONOSCOPY  2008   cleared for 10 yrs- Dr Servando SnareWohl    History reviewed. No pertinent family history.  Social History   Socioeconomic History  . Marital status: Married    Spouse name: Not on file  . Number of children: Not on file  . Years of education: Not on file  . Highest education level: Not on file  Social Needs  . Financial resource strain: Not on file  . Food insecurity - worry: Not on file  . Food insecurity - inability: Not on file  . Transportation needs - medical: Not on file  . Transportation needs - non-medical: Not on file  Occupational History  . Not on file  Tobacco Use  . Smoking status: Never Smoker  . Smokeless tobacco: Never Used  Substance and Sexual Activity  . Alcohol use: Yes    Alcohol/week: 0.0 oz  . Drug use: No  . Sexual activity: Yes  Other Topics Concern  . Not on file  Social History Narrative  . Not on file    No Known Allergies  Outpatient  Medications Prior to Visit  Medication Sig Dispense Refill  . acetaminophen (TYLENOL) 500 MG tablet Take 500 mg by mouth every 6 (six) hours as needed.    . Pseudoephedrine HCl (NASAL DECONGESTANT ADULT PO) Take by mouth.    . predniSONE (DELTASONE) 10 MG tablet Take 1 tablet (10 mg total) by mouth daily with breakfast. 30 tablet 0   No facility-administered medications prior to visit.     Review of Systems  Constitutional: Negative for chills, diaphoresis, fever, malaise/fatigue and weight loss.  HENT: Positive for congestion and sinus pressure. Negative for ear discharge, ear pain, hoarse voice, sneezing and sore throat.   Eyes: Negative for blurred vision.  Respiratory: Negative for cough, sputum production, shortness of breath and wheezing.   Cardiovascular: Negative for chest pain, palpitations and leg swelling.  Gastrointestinal: Negative for abdominal pain, blood in stool, constipation, diarrhea, heartburn, melena and nausea.  Genitourinary: Negative for dysuria, frequency, hematuria and urgency.  Musculoskeletal: Negative for back pain, joint pain, myalgias and neck pain.  Skin: Negative for rash.  Neurological: Negative for dizziness, tingling, sensory change, focal weakness and headaches.  Endo/Heme/Allergies: Negative for environmental allergies and polydipsia. Does not bruise/bleed easily.  Psychiatric/Behavioral: Negative for depression and suicidal ideas. The patient is not nervous/anxious and does not have insomnia.      Objective  Vitals:   04/05/17 1454  BP: 118/80  Pulse: 78  Resp: 16  SpO2: 98%  Weight: 189 lb (85.7 kg)  Height: 5\' 8"  (1.727 m)    Physical Exam  Constitutional: He is oriented to person, place, and time and well-developed, well-nourished, and in no distress.  HENT:  Head: Normocephalic.  Right Ear: External ear normal.  Left Ear: External ear normal.  Nose: Nose normal.  Mouth/Throat: Oropharynx is clear and moist.  Eyes: Conjunctivae  and EOM are normal. Pupils are equal, round, and reactive to light. Right eye exhibits no discharge. Left eye exhibits no discharge. No scleral icterus.  Neck: Normal range of motion. Neck supple. No JVD present. No tracheal deviation present. No thyromegaly present.  Cardiovascular: Normal rate, regular rhythm, normal heart sounds and intact distal pulses. Exam reveals no gallop and no friction rub.  No murmur heard. Pulmonary/Chest: Breath sounds normal. No respiratory distress. He has no wheezes. He has no rales.  Abdominal: Soft. Bowel sounds are normal. He exhibits no mass. There is no hepatosplenomegaly. There is no tenderness. There is no rebound, no guarding and no CVA tenderness.  Musculoskeletal: Normal range of motion. He exhibits no edema or tenderness.  Lymphadenopathy:    He has no cervical adenopathy.  Neurological: He is alert and oriented to person, place, and time. He has normal sensation, normal strength, normal reflexes and intact cranial nerves. No cranial nerve deficit.  Skin: Skin is warm. No rash noted.  Psychiatric: Mood and affect normal.  Nursing note and vitals reviewed.     Assessment & Plan  Problem List Items Addressed This Visit    None    Visit Diagnoses    Acute maxillary sinusitis, recurrence not specified    -  Primary   Relevant Medications   Pseudoephedrine HCl (NASAL DECONGESTANT ADULT PO)   amoxicillin-clavulanate (AUGMENTIN) 875-125 MG tablet      Meds ordered this encounter  Medications  . amoxicillin-clavulanate (AUGMENTIN) 875-125 MG tablet    Sig: Take 1 tablet by mouth 2 (two) times daily.    Dispense:  20 tablet    Refill:  0      Dr. Elizabeth Sauereanna [facility] Polhemus [facility]  04/05/17

## 2017-07-15 ENCOUNTER — Encounter: Payer: Self-pay | Admitting: Family Medicine

## 2017-07-15 ENCOUNTER — Ambulatory Visit: Payer: 59 | Admitting: Family Medicine

## 2017-07-15 VITALS — BP 120/80 | HR 80 | Ht 68.0 in | Wt 190.0 lb

## 2017-07-15 DIAGNOSIS — R55 Syncope and collapse: Secondary | ICD-10-CM | POA: Diagnosis not present

## 2017-07-15 DIAGNOSIS — B001 Herpesviral vesicular dermatitis: Secondary | ICD-10-CM | POA: Diagnosis not present

## 2017-07-15 DIAGNOSIS — Z8669 Personal history of other diseases of the nervous system and sense organs: Secondary | ICD-10-CM | POA: Insufficient documentation

## 2017-07-15 DIAGNOSIS — E860 Dehydration: Secondary | ICD-10-CM | POA: Diagnosis not present

## 2017-07-15 DIAGNOSIS — R42 Dizziness and giddiness: Secondary | ICD-10-CM

## 2017-07-15 MED ORDER — PREDNISONE 10 MG PO TABS: 10.0000 mg | ORAL_TABLET | Freq: Every day | ORAL | 0 refills | Status: DC

## 2017-07-15 MED ORDER — MECLIZINE HCL 25 MG PO TABS: 25.0000 mg | ORAL_TABLET | Freq: Three times a day (TID) | ORAL | 0 refills | Status: DC | PRN

## 2017-07-15 NOTE — Progress Notes (Signed)
Name: Dustin Morse.   MRN: [id number]    DOB: 06-18-1975   Date:07/15/2017       Progress Note  Subjective  Chief Complaint  Chief Complaint  Patient presents with  . Dizziness    nausea, clammy feeling, vision changes, "missed the latch" when reaching to open cabinet. pressure behind ears    Dizziness  This is a new problem. The current episode started in the past 7 days (1 week ago). The problem occurs 2 to 4 times per day. The problem has been gradually worsening. Associated symptoms include congestion, diaphoresis, headaches, nausea, vertigo and a visual change. Pertinent negatives include no abdominal pain, anorexia, arthralgias, change in bowel habit, chest pain, chills, coughing, fatigue, fever, joint swelling, myalgias, neck pain, numbness, rash, sore throat, swollen glands, urinary symptoms, vomiting or weakness. The symptoms are aggravated by standing (just out of bed). He has tried drinking for the symptoms. The treatment provided mild relief.  Loss of Consciousness  This is a new (near syncope) problem. The current episode started today. The problem occurs 2 to 4 times per day. The problem has been gradually improving (duration3-5 minutes). There was no loss of consciousness. The symptoms are aggravated by standing. Associated symptoms include diaphoresis, dizziness, headaches, malaise/fatigue, nausea, vertigo and a visual change. Pertinent negatives include no abdominal pain, auditory change, aura, back pain, bladder incontinence, bowel incontinence, chest pain, clumsiness, confusion, fever, focal sensory loss, focal weakness, light-headedness, palpitations, slurred speech, vomiting or weakness. Associated symptoms comments: aphasia. He has tried drinking for the symptoms. The treatment provided mild relief. His past medical history is significant for vertigo. There is no history of CAD, CVA, DM, HTN, seizures or TIA.    No problem-specific Assessment & Plan notes found for this  encounter.   Past Medical History:  Diagnosis Date  . Allergy   . Recurrent cold sores     Past Surgical History:  Procedure Laterality Date  . COLONOSCOPY  2008   cleared for 10 yrs- Dr Servando Snare    History reviewed. No pertinent family history.  Social History   Socioeconomic History  . Marital status: Married    Spouse name: Not on file  . Number of children: Not on file  . Years of education: Not on file  . Highest education level: Not on file  Occupational History  . Not on file  Social Needs  . Financial resource strain: Not on file  . Food insecurity:    Worry: Not on file    Inability: Not on file  . Transportation needs:    Medical: Not on file    Non-medical: Not on file  Tobacco Use  . Smoking status: Never Smoker  . Smokeless tobacco: Never Used  Substance and Sexual Activity  . Alcohol use: Yes    Alcohol/week: 0.0 oz  . Drug use: No  . Sexual activity: Yes  Lifestyle  . Physical activity:    Days per week: Not on file    Minutes per session: Not on file  . Stress: Not on file  Relationships  . Social connections:    Talks on phone: Not on file    Gets together: Not on file    Attends religious service: Not on file    Active member of club or organization: Not on file    Attends meetings of clubs or organizations: Not on file    Relationship status: Not on file  . Intimate partner violence:    Fear of current  or ex partner: Not on file    Emotionally abused: Not on file    Physically abused: Not on file    Forced sexual activity: Not on file  Other Topics Concern  . Not on file  Social History Narrative  . Not on file    No Known Allergies  Outpatient Medications Prior to Visit  Medication Sig Dispense Refill  . acetaminophen (TYLENOL) 500 MG tablet Take 500 mg by mouth every 6 (six) hours as needed.    Marland Kitchen. amoxicillin-clavulanate (AUGMENTIN) 875-125 MG tablet Take 1 tablet by mouth 2 (two) times daily. 20 tablet 0  . Pseudoephedrine HCl  (NASAL DECONGESTANT ADULT PO) Take by mouth.     No facility-administered medications prior to visit.     Review of Systems  Constitutional: Positive for diaphoresis and malaise/fatigue. Negative for chills, fatigue, fever and weight loss.  HENT: Positive for congestion. Negative for ear discharge, ear pain and sore throat.   Eyes: Negative for blurred vision.  Respiratory: Negative for cough, sputum production, shortness of breath and wheezing.   Cardiovascular: Positive for syncope. Negative for chest pain, palpitations and leg swelling.  Gastrointestinal: Positive for nausea. Negative for abdominal pain, anorexia, blood in stool, bowel incontinence, change in bowel habit, constipation, diarrhea, heartburn, melena and vomiting.  Genitourinary: Negative for bladder incontinence, dysuria, frequency, hematuria and urgency.  Musculoskeletal: Negative for arthralgias, back pain, joint pain, joint swelling, myalgias and neck pain.  Skin: Negative for rash.  Neurological: Positive for dizziness, vertigo and headaches. Negative for tingling, sensory change, focal weakness, weakness, light-headedness and numbness.  Endo/Heme/Allergies: Negative for environmental allergies and polydipsia. Does not bruise/bleed easily.  Psychiatric/Behavioral: Negative for confusion, depression and suicidal ideas. The patient is not nervous/anxious and does not have insomnia.      Objective  Vitals:   07/15/17 0806  BP: 120/80  Pulse: 80  Weight: 190 lb (86.2 kg)  Height: 5\' 8"  (1.727 m)    Physical Exam  Constitutional: He is oriented to person, place, and time.  HENT:  Head: Normocephalic.  Right Ear: External ear normal.  Left Ear: External ear normal.  Nose: Nose normal.  Mouth/Throat: Oropharynx is clear and moist.  Eyes: Pupils are equal, round, and reactive to light. Conjunctivae and EOM are normal. Right eye exhibits no discharge. Left eye exhibits no discharge. No scleral icterus.  Neck:  Normal range of motion. Neck supple. No JVD present. No tracheal deviation present. No thyromegaly present.  Cardiovascular: Normal rate, regular rhythm, normal heart sounds and intact distal pulses. Exam reveals no gallop and no friction rub.  No murmur heard. Pulmonary/Chest: Breath sounds normal. No respiratory distress. He has no wheezes. He has no rales.  Abdominal: Soft. Bowel sounds are normal. He exhibits no mass. There is no hepatosplenomegaly. There is no tenderness. There is no rebound, no guarding and no CVA tenderness.  Musculoskeletal: Normal range of motion. He exhibits no edema or tenderness.  Lymphadenopathy:    He has no cervical adenopathy.  Neurological: He is alert and oriented to person, place, and time. He has normal strength and normal reflexes. No cranial nerve deficit or sensory deficit.  Reflex Scores:      Tricep reflexes are 2+ on the right side and 2+ on the left side.      Bicep reflexes are 2+ on the right side and 2+ on the left side.      Brachioradialis reflexes are 2+ on the right side and 2+ on the left side.  Patellar reflexes are 2+ on the right side and 2+ on the left side.      Achilles reflexes are 2+ on the right side and 2+ on the left side. Skin: Skin is warm. No rash noted.  Nursing note and vitals reviewed.     Assessment & Plan  Problem List Items Addressed This Visit      Digestive   Recurrent cold sores     Other   Hx of labyrinthitis    Other Visit Diagnoses    Vasovagal near syncope    -  Primary   Relevant Orders   EKG 12-Lead (Completed)   Vertigo       Relevant Medications   meclizine (ANTIVERT) 25 MG tablet   Dehydration          Meds ordered this encounter  Medications  . meclizine (ANTIVERT) 25 MG tablet    Sig: Take 1 tablet (25 mg total) by mouth 3 (three) times daily as needed for dizziness.    Dispense:  30 tablet    Refill:  0  . predniSONE (DELTASONE) 10 MG tablet    Sig: Take 1 tablet (10 mg total)  by mouth daily with breakfast.    Dispense:  30 tablet    Refill:  0      Dr. [facility]den [facility]  07/15/17

## 2017-07-15 NOTE — Patient Instructions (Signed)
Vasovagal Syncope, Adult  Syncope, which is commonly known as fainting or passing out, is a temporary loss of consciousness. It occurs when the blood flow to the brain is reduced. Vasovagal syncope, also called neurocardiogenic syncope, is a fainting spell that happens when blood flow to the brain is reduced because of a sudden drop in heart rate and blood pressure.  Vasovagal syncope is usually harmless. However, you can get injured if you fall during a fainting spell.  What are the causes?  This condition is caused by a drop in heart rate and blood pressure, usually in response to a trigger. Many things and situations can trigger an episode, including:  · Pain.  · Fear.  · The sight of blood. This may occur during medical procedures, such as when blood is being drawn from a vein.  · Common activities, such as coughing, swallowing, stretching, or going to the bathroom.  · Emotional stress.  · Being in a confined space.  · Prolonged standing, especially in a warm environment.  · Lack of sleep or rest.  · Not eating for a long time.  · Not drinking enough liquids.  · Recent illness.  · Drinking alcohol.  · Taking drugs that affect blood pressure, such as marijuana, cocaine, opiates, or inhalants.    What are the signs or symptoms?  Before a fainting episode, you may:  · Feel dizzy or light-headed.  · Become pale.  · Sense that you are going to faint.  · Feel like the room is spinning.  · Only see directly ahead (tunnel vision).  · Feel sick to your stomach (nauseous).  · See spots.  · Slowly lose vision.  · Hear ringing in your ears.  · Have a headache.  · Feel warm and sweaty.  · Feel a sensation of pins and needles.    During the fainting spell, you may twitch or make jerky movements. Fainting spells usually last no longer than a few minutes before you wake up. If you get up too quickly before your body can recover, you may faint again.  How is this diagnosed?  This condition is diagnosed based on your symptoms,  your medical history, and a physical exam. Tests may be done to rule out other causes of fainting. Tests may include:  · Blood tests.  · Heart tests, such as an electrocardiogram (ECG), echocardiogram, or electrophysiology study.  · A test to check your response to changes in position (tilt table test).    How is this treated?  Usually, treatment is not needed for this condition. Your health care provider may suggest ways to help prevent fainting episodes. These may include:  · Drinking additional fluids if you are exposed to a trigger.  · Sitting or lying down if you notice signs that an episode is coming.    If your fainting spells continue, your health care provider may recommend that you:  · Take medicines to prevent fainting or to help reduce further episodes of fainting.  · Do certain exercises.  · Wear compression stockings.  · Have surgery to place a pacemaker in your body (rare).    Follow these instructions at home:  · Learn to identify the signs that an episode is coming.  · Sit or lie down at the first sign of a fainting spell. If you sit down, put your head down between your legs. If you lie down, swing your legs up in the air to increase blood flow to the brain.  ·   Avoid hot tubs and saunas.  · Avoid standing for a long time. If you have to stand for a long time, try:  ? Crossing your legs.  ? Flexing and stretching your leg muscles.  ? Squatting.  ? Moving your legs.  ? Bending over.  · Drink enough fluid to keep your urine clear or pale yellow.  · Make changes to your diet that your health care provider recommends. You may be told to:  ? Avoid caffeine.  ? Eat more salt.  · Take over-the-counter and prescription medicines only as told by your health care provider.  Contact a health care provider if:  · You continue to have fainting spells despite treatment.  · You faint more often despite treatment.  · You lose consciousness for more than a few minutes.  · You faint during or after exercising or  after being startled.  · You have twitching or jerky movements for longer than a few seconds during a fainting spell.  · You have an episode of twitching or jerky movements without fainting.  Get help right away if:  · A fainting spell leads to an injury or bleeding.  · You have new symptoms that occur with the fainting spells, such as:  ? Shortness of breath.  ? Chest pain.  ? Irregular heartbeat.  · You twitch or make jerky movements for more than 5 minutes.  · You twitch or make jerky movements during more than one fainting spell.  This information is not intended to replace advice given to you by your health care provider. Make sure you discuss any questions you have with your health care provider.  Document Released: 03/09/2012 Document Revised: 09/04/2015 Document Reviewed: 01/19/2015  [employer] Interactive Patient Education © 2018 [employer]

## 2017-07-19 ENCOUNTER — Other Ambulatory Visit: Payer: Self-pay

## 2017-07-19 DIAGNOSIS — R42 Dizziness and giddiness: Secondary | ICD-10-CM

## 2017-07-21 ENCOUNTER — Ambulatory Visit: Payer: 59 | Admitting: Family Medicine

## 2017-11-16 ENCOUNTER — Encounter: Payer: Self-pay | Admitting: Family Medicine

## 2017-11-16 ENCOUNTER — Ambulatory Visit: Payer: 59 | Admitting: Family Medicine

## 2017-11-16 VITALS — BP 120/80 | HR 76 | Ht 68.0 in | Wt 190.0 lb

## 2017-11-16 DIAGNOSIS — K2901 Acute gastritis with bleeding: Secondary | ICD-10-CM | POA: Diagnosis not present

## 2017-11-16 MED ORDER — PANTOPRAZOLE SODIUM 40 MG PO TBEC: 40.0000 mg | DELAYED_RELEASE_TABLET | Freq: Every day | ORAL | 3 refills | Status: DC

## 2017-11-16 NOTE — Progress Notes (Signed)
Name: Dustin MaidensRichard P Steffey Jr.   MRN: [id number]    DOB: 02/27/1976   Date:11/16/2017       Progress Note  Subjective  Chief Complaint  Chief Complaint  Patient presents with  . gastric ulcer    having heartburn, feeling full after eating little amount, gassy x 1 week. Started after taking an excedrin migraine and drinking sodas. Has noticed some blood in stools    Abdominal Pain  This is a recurrent problem. The current episode started 1 to 4 weeks ago. The onset quality is sudden. The problem occurs 2 to 4 times per day (while on mountain trip). The problem has been gradually worsening. The pain is located in the epigastric region. The pain is at a severity of 5/10. The quality of the pain is burning. The abdominal pain does not radiate. Associated symptoms include diarrhea, dysuria, headaches and hematochezia. Pertinent negatives include no anorexia, arthralgias, belching, constipation, fever, flatus, frequency, hematuria, melena, myalgias, nausea, vomiting or weight loss. The pain is aggravated by drinking alcohol and NSAIDs. The pain is relieved by certain positions. He has tried proton pump inhibitors for the symptoms. The treatment provided mild relief. Prior diagnostic workup includes lower endoscopy (colonoscopy 2007/?). His past medical history is significant for irritable bowel syndrome. There is no history of Crohn's disease. was check for Chrohns/    No problem-specific Assessment & Plan notes found for this encounter.   Past Medical History:  Diagnosis Date  . Allergy   . Recurrent cold sores     Past Surgical History:  Procedure Laterality Date  . COLONOSCOPY  2008   cleared for 10 yrs- Dr Servando SnareWohl    History reviewed. No pertinent family history.  Social History   Socioeconomic History  . Marital status: Married    Spouse name: Not on file  . Number of children: Not on file  . Years of education: Not on file  . Highest education level: Not on file  Occupational History   . Not on file  Social Needs  . Financial resource strain: Not on file  . Food insecurity:    Worry: Not on file    Inability: Not on file  . Transportation needs:    Medical: Not on file    Non-medical: Not on file  Tobacco Use  . Smoking status: Never Smoker  . Smokeless tobacco: Never Used  Substance and Sexual Activity  . Alcohol use: Yes    Alcohol/week: 0.0 standard drinks  . Drug use: No  . Sexual activity: Yes  Lifestyle  . Physical activity:    Days per week: Not on file    Minutes per session: Not on file  . Stress: Not on file  Relationships  . Social connections:    Talks on phone: Not on file    Gets together: Not on file    Attends religious service: Not on file    Active member of club or organization: Not on file    Attends meetings of clubs or organizations: Not on file    Relationship status: Not on file  . Intimate partner violence:    Fear of current or ex partner: Not on file    Emotionally abused: Not on file    Physically abused: Not on file    Forced sexual activity: Not on file  Other Topics Concern  . Not on file  Social History Narrative  . Not on file    No Known Allergies  Outpatient Medications Prior to  Visit  Medication Sig Dispense Refill  . acetaminophen (TYLENOL) 500 MG tablet Take 500 mg by mouth every 6 (six) hours as needed.    . meclizine (ANTIVERT) 25 MG tablet Take 1 tablet (25 mg total) by mouth 3 (three) times daily as needed for dizziness. (Patient not taking: Reported on 11/16/2017) 30 tablet 0  . predniSONE (DELTASONE) 10 MG tablet Take 1 tablet (10 mg total) by mouth daily with breakfast. (Patient not taking: Reported on 11/16/2017) 30 tablet 0   No facility-administered medications prior to visit.     Review of Systems  Constitutional: Negative for chills, fever, malaise/fatigue and weight loss.  HENT: Negative for ear discharge, ear pain and sore throat.   Eyes: Negative for blurred vision.  Respiratory: Negative  for cough, sputum production, shortness of breath and wheezing.   Cardiovascular: Negative for chest pain, palpitations and leg swelling.  Gastrointestinal: Positive for abdominal pain, diarrhea and hematochezia. Negative for anorexia, blood in stool, constipation, flatus, heartburn, melena, nausea and vomiting.  Genitourinary: Positive for dysuria. Negative for frequency, hematuria and urgency.  Musculoskeletal: Negative for arthralgias, back pain, joint pain, myalgias and neck pain.  Skin: Negative for rash.  Neurological: Positive for headaches. Negative for dizziness, tingling, sensory change and focal weakness.  Endo/Heme/Allergies: Negative for environmental allergies and polydipsia. Does not bruise/bleed easily.  Psychiatric/Behavioral: Negative for depression and suicidal ideas. The patient is not nervous/anxious and does not have insomnia.      Objective  Vitals:   11/16/17 0854  BP: 120/80  Pulse: 76  Weight: 190 lb (86.2 kg)  Height: 5\' 8"  (1.727 m)    Physical Exam  Constitutional: He is oriented to person, place, and time.  HENT:  Head: Normocephalic.  Right Ear: External ear normal.  Left Ear: External ear normal.  Nose: Nose normal.  Mouth/Throat: Oropharynx is clear and moist.  Eyes: Pupils are equal, round, and reactive to light. Conjunctivae and EOM are normal. Right eye exhibits no discharge. Left eye exhibits no discharge. No scleral icterus.  Neck: Normal range of motion. Neck supple. No JVD present. No tracheal deviation present. No thyromegaly present.  Cardiovascular: Normal rate, regular rhythm, normal heart sounds and intact distal pulses. Exam reveals no gallop and no friction rub.  No murmur heard. Pulmonary/Chest: Breath sounds normal. No respiratory distress. He has no wheezes. He has no rales.  Abdominal: Soft. Bowel sounds are normal. He exhibits no pulsatile liver, no pulsatile midline mass and no mass. There is no hepatosplenomegaly. There is no  tenderness. There is no rebound, no guarding and no CVA tenderness.  Genitourinary: Rectum normal. Rectal exam shows guaiac negative stool.  Musculoskeletal: Normal range of motion. He exhibits no edema or tenderness.  Lymphadenopathy:    He has no cervical adenopathy.  Neurological: He is alert and oriented to person, place, and time. He has normal strength and normal reflexes. No cranial nerve deficit.  Skin: Skin is warm. No rash noted.  Nursing note and vitals reviewed.     Assessment & Plan  Problem List Items Addressed This Visit    None    Visit Diagnoses    Acute gastritis with hemorrhage, unspecified gastritis type    -  Primary   Acute recurrent. Will eval with hemoccultsx3,h pylori, hgb and trial of protonix.  Next step gi consult.   Relevant Medications   pantoprazole (PROTONIX) 40 MG tablet   Other Relevant Orders   H. pylori antibody, IgG   Hemoglobin  Meds ordered this encounter  Medications  . pantoprazole (PROTONIX) 40 MG tablet    Sig: Take 1 tablet (40 mg total) by mouth daily.    Dispense:  30 tablet    Refill:  3  went over directions on collecting stool samples for hemoccults. Will turn in next week    Dr. Hayden Rasmusseneanna Rielly [facility]  11/16/17

## 2017-11-17 LAB — H. PYLORI ANTIBODY, IGG

## 2017-11-17 LAB — HEMOGLOBIN: HEMOGLOBIN: 15.1 g/dL (ref 13.0–17.7)

## 2017-11-26 ENCOUNTER — Other Ambulatory Visit (INDEPENDENT_AMBULATORY_CARE_PROVIDER_SITE_OTHER): Payer: 59

## 2017-11-26 DIAGNOSIS — K2901 Acute gastritis with bleeding: Secondary | ICD-10-CM | POA: Diagnosis not present

## 2017-11-26 DIAGNOSIS — B001 Herpesviral vesicular dermatitis: Secondary | ICD-10-CM

## 2017-11-26 LAB — HEMOCCULT GUIAC POC 1CARD (OFFICE)
Card #2 Fecal Occult Blod, POC: NEGATIVE
FECAL OCCULT BLD: NEGATIVE
FECAL OCCULT BLD: NEGATIVE

## 2017-11-26 MED ORDER — PREDNISONE 10 MG PO TABS: 10.0000 mg | ORAL_TABLET | Freq: Every day | ORAL | 0 refills | Status: DC

## 2017-11-26 NOTE — Addendum Note (Signed)
Addended by: Everitt AmberLYNCH, Leiam Hopwood L on: 11/26/2017 01:41 PM   Modules accepted: Orders

## 2018-02-14 ENCOUNTER — Encounter: Payer: Self-pay | Admitting: Family Medicine

## 2018-02-14 ENCOUNTER — Ambulatory Visit: Payer: 59 | Admitting: Family Medicine

## 2018-02-14 VITALS — BP 122/80 | HR 76 | Ht 68.0 in | Wt 201.0 lb

## 2018-02-14 DIAGNOSIS — J0100 Acute maxillary sinusitis, unspecified: Secondary | ICD-10-CM

## 2018-02-14 DIAGNOSIS — B001 Herpesviral vesicular dermatitis: Secondary | ICD-10-CM

## 2018-02-14 MED ORDER — AMOXICILLIN 500 MG PO CAPS: 500.0000 mg | ORAL_CAPSULE | Freq: Three times a day (TID) | ORAL | 0 refills | Status: DC

## 2018-02-14 MED ORDER — PREDNISONE 10 MG PO TABS: 10.0000 mg | ORAL_TABLET | Freq: Every day | ORAL | 0 refills | Status: DC

## 2018-02-14 NOTE — Progress Notes (Signed)
Date:  02/14/2018   Name:  Dustin Morse.   DOB:  [birth year]   MRN:  [id number]   Chief Complaint: Sinusitis (cong, yellow production) and Mouth Lesions (needs refill on prednisone for ulcer) Sinusitis  This is a new problem. The current episode started in the past 7 days (monday). The problem is unchanged. There has been no fever. The pain is mild. Associated symptoms include congestion and sinus pressure. Pertinent negatives include no chills, coughing, diaphoresis, ear pain, headaches, hoarse voice, neck pain, shortness of breath, sneezing or sore throat. Past treatments include nothing. The treatment provided no relief.  Mouth Lesions   The current episode started 5 to 7 days ago. The onset was sudden. The problem has been gradually worsening. The symptoms are relieved by one or more prescription drugs. Associated symptoms include congestion, mouth sores and rhinorrhea. Pertinent negatives include no fever, no abdominal pain, no constipation, no diarrhea, no nausea, no ear discharge, no ear pain, no headaches, no sore throat, no neck pain, no cough, no wheezing and no rash.     Review of Systems  Constitutional: Negative for chills, diaphoresis and fever.  HENT: Positive for congestion, mouth sores, rhinorrhea and sinus pressure. Negative for drooling, ear discharge, ear pain, hoarse voice, sneezing and sore throat.   Respiratory: Negative for cough, shortness of breath and wheezing.   Cardiovascular: Negative for chest pain, palpitations and leg swelling.  Gastrointestinal: Negative for abdominal pain, blood in stool, constipation, diarrhea and nausea.  Endocrine: Negative for polydipsia.  Genitourinary: Negative for dysuria, frequency, hematuria and urgency.  Musculoskeletal: Negative for back pain, myalgias and neck pain.  Skin: Negative for rash.  Allergic/Immunologic: Negative for environmental allergies.  Neurological: Negative for dizziness and headaches.  Hematological:  Does not bruise/bleed easily.  Psychiatric/Behavioral: Negative for suicidal ideas. The patient is not nervous/anxious.     Patient Active Problem List   Diagnosis Date Noted  . Hx of labyrinthitis 07/15/2017  . 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 COLONOSCOPY  2008   cleared for 10 yrs- Dr Servando Snare    Social History   Tobacco Use  . Smoking status: Never Smoker  . Smokeless tobacco: Never Used  Substance Use Topics  . Alcohol use: Yes    Alcohol/week: 0.0 standard drinks  . Drug use: No     Medication list has been reviewed and updated.  Current Meds  Medication Sig  . acetaminophen (TYLENOL) 500 MG tablet Take 500 mg by mouth every 6 (six) hours as needed.  . pantoprazole (PROTONIX) 40 MG tablet Take 1 tablet (40 mg total) by mouth daily.  . predniSONE (DELTASONE) 10 MG tablet Take 1 tablet (10 mg total) by mouth daily with breakfast.    PHQ 2/9 Scores 04/05/2017 09/09/2015  PHQ - 2 Score 0 0    Physical Exam  Constitutional: He is oriented to person, place, and time.  HENT:  Head: Normocephalic.  Right Ear: Hearing, tympanic membrane, external ear and ear canal normal.  Left Ear: Hearing, tympanic membrane, external ear and ear canal normal.  Nose: Nose normal.  Mouth/Throat: Uvula is midline, oropharynx is clear and moist and mucous membranes are normal. No oropharyngeal exudate, posterior oropharyngeal edema or posterior oropharyngeal erythema.  Eyes: Pupils are equal, round, and reactive to light. Conjunctivae and EOM are normal. Right eye exhibits no discharge. Left eye exhibits no discharge. No scleral icterus.  Neck: Normal  range of motion. Neck supple. No JVD present. No tracheal deviation present. No thyromegaly present.  Cardiovascular: Normal rate, regular rhythm, normal heart sounds and intact distal pulses. Exam reveals no gallop and no friction rub.  No murmur  heard. Pulmonary/Chest: Breath sounds normal. No respiratory distress. He has no wheezes. He has no rales.  Abdominal: Soft. Bowel sounds are normal. He exhibits no mass. There is no hepatosplenomegaly. There is no tenderness. There is no rebound, no guarding and no CVA tenderness.  Musculoskeletal: Normal range of motion. He exhibits no edema or tenderness.  Lymphadenopathy:    He has no cervical adenopathy.  Neurological: He is alert and oriented to person, place, and time. He has normal strength and normal reflexes. No cranial nerve deficit.  Skin: Skin is warm. No rash noted.    BP 122/80   Pulse 76   Ht 5\' 8"  (1.727 m)   Wt 201 lb (91.2 kg)   BMI 30.56 kg/m   Assessment and Plan:  1. Acute maxillary sinusitis, recurrence not specified Acute Initiate Amoxil 500 tid for 10 days - amoxicillin (AMOXIL) 500 MG capsule; Take 1 capsule (500 mg total) by mouth 3 (three) times daily.  Dispense: 30 capsule; Refill: 0  2. Recurrent cold sores Acute. Recurrent Usually responds to prednisone taper (4,4,3,3,2,2,1,1). - predniSONE (DELTASONE) 10 MG tablet; Take 1 tablet (10 mg total) by mouth daily with breakfast.  Dispense: 30 tablet; Refill: 0   Dr. Hayden [facility]  02/14/2018

## 2018-03-10 ENCOUNTER — Telehealth: Payer: Self-pay

## 2018-03-10 ENCOUNTER — Other Ambulatory Visit: Payer: Self-pay

## 2018-03-10 DIAGNOSIS — J0100 Acute maxillary sinusitis, unspecified: Secondary | ICD-10-CM

## 2018-03-10 MED ORDER — AZITHROMYCIN 250 MG PO TABS: ORAL_TABLET | ORAL | 0 refills | Status: DC

## 2018-03-10 NOTE — Progress Notes (Signed)
Sent in [employer] 

## 2018-03-10 NOTE — Telephone Encounter (Signed)
Pt called stating sinus infection has came back. Had a round of Amox- [facility]

## 2019-01-10 ENCOUNTER — Other Ambulatory Visit: Payer: Self-pay

## 2019-01-10 ENCOUNTER — Encounter: Payer: Self-pay | Admitting: Family Medicine

## 2019-01-10 ENCOUNTER — Ambulatory Visit: Payer: 59 | Admitting: Family Medicine

## 2019-01-10 VITALS — BP 120/70 | HR 76 | Ht 68.0 in | Wt 191.0 lb

## 2019-01-10 DIAGNOSIS — R42 Dizziness and giddiness: Secondary | ICD-10-CM | POA: Diagnosis not present

## 2019-01-10 DIAGNOSIS — H6982 Other specified disorders of Eustachian tube, left ear: Secondary | ICD-10-CM | POA: Diagnosis not present

## 2019-01-10 DIAGNOSIS — B001 Herpesviral vesicular dermatitis: Secondary | ICD-10-CM | POA: Diagnosis not present

## 2019-01-10 MED ORDER — FLUTICASONE PROPIONATE 50 MCG/ACT NA SUSP
2.0000 | Freq: Every day | NASAL | 6 refills | Status: DC
Start: 2019-01-10 — End: 2019-06-12

## 2019-01-10 MED ORDER — PREDNISONE 10 MG PO TABS: 10.0000 mg | ORAL_TABLET | Freq: Every day | ORAL | 0 refills | Status: DC

## 2019-01-10 MED ORDER — MECLIZINE HCL 25 MG PO TABS: 25.0000 mg | ORAL_TABLET | Freq: Three times a day (TID) | ORAL | 0 refills | Status: DC | PRN

## 2019-01-10 NOTE — Progress Notes (Signed)
Date:  01/10/2019   Name:  Dustin Morse.   DOB:  1976/01/01   MRN:  [id number]   Chief Complaint: Ear Pain (ear popped and has dizziness. hx of vertigo. Has had nausea with it. x 4 days. Has been doing exercises that he was told to do at ENT.) and Mouth Lesions (needs refill on prednisone)  Mouth Lesions  The current episode started more than 1 week ago. The onset was gradual. The problem occurs occasionally. The problem has been rapidly improving. The problem is mild. The symptoms are relieved by one or more prescription drugs. The symptoms are aggravated by eating. Associated symptoms include ear pain and mouth sores. Pertinent negatives include no fever, no abdominal pain, no constipation, no diarrhea, no nausea, no vomiting, no congestion, no ear discharge, no headaches, no hearing loss, no rhinorrhea, no sore throat, no neck pain, no cough, no wheezing and no rash.  Otalgia  There is pain in the left ear. This is a chronic problem. The problem occurs constantly. The problem has been waxing and waning. There has been no fever. Pertinent negatives include no abdominal pain, coughing, diarrhea, ear discharge, headaches, hearing loss, neck pain, rash, rhinorrhea, sore throat or vomiting. He has tried nothing for the symptoms. The treatment provided moderate relief. His past medical history is significant for a chronic ear infection.    Review of Systems  Constitutional: Negative for chills and fever.  HENT: Positive for ear pain and mouth sores. Negative for congestion, drooling, ear discharge, hearing loss, postnasal drip, rhinorrhea and sore throat.   Respiratory: Negative for cough, shortness of breath and wheezing.   Cardiovascular: Negative for chest pain, palpitations and leg swelling.  Gastrointestinal: Negative for abdominal pain, blood in stool, constipation, diarrhea, nausea and vomiting.  Endocrine: Negative for polydipsia.  Genitourinary: Negative for dysuria, frequency,  hematuria and urgency.  Musculoskeletal: Negative for back pain, myalgias and neck pain.  Skin: Negative for rash.  Allergic/Immunologic: Negative for environmental allergies.  Neurological: Negative for dizziness and headaches.  Hematological: Does not bruise/bleed easily.  Psychiatric/Behavioral: Negative for suicidal ideas. The patient is not nervous/anxious.     Patient Active Problem List   Diagnosis Date Noted  . Hx of labyrinthitis 07/15/2017  . 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 COLONOSCOPY  2008   cleared for 10 yrs- Dr Servando Snare    Social History   Tobacco Use  . Smoking status: Never Smoker  . Smokeless tobacco: Never Used  Substance Use Topics  . Alcohol use: Yes    Alcohol/week: 0.0 standard drinks  . Drug use: No     Medication list has been reviewed and updated.  Current Meds  Medication Sig  . acetaminophen (TYLENOL) 500 MG tablet Take 500 mg by mouth every 6 (six) hours as needed.  . meclizine (ANTIVERT) 25 MG tablet Take 1 tablet (25 mg total) by mouth 3 (three) times daily as needed for dizziness.    PHQ 2/9 Scores 04/05/2017 09/09/2015  PHQ - 2 Score 0 0    BP Readings from Last 3 Encounters:  01/10/19 120/70  02/14/18 122/80  11/16/17 120/80    Physical Exam Vitals signs and nursing note reviewed.  HENT:     Head: Normocephalic.     Jaw: There is normal jaw occlusion.     Right Ear: Hearing, tympanic membrane, ear canal and external ear normal.  Left Ear: Hearing, tympanic membrane, ear canal and external ear normal.     Nose: Nose normal.     Mouth/Throat:     Lips: Pink.     Mouth: Mucous membranes are moist.  Eyes:     General: No scleral icterus.       Right eye: No discharge.        Left eye: No discharge.     Conjunctiva/sclera: Conjunctivae normal.     Pupils: Pupils are equal, round, and reactive to light.  Neck:     Musculoskeletal: Normal range of  motion and neck supple.     Thyroid: No thyromegaly.     Vascular: No JVD.     Trachea: No tracheal deviation.  Cardiovascular:     Rate and Rhythm: Normal rate and regular rhythm.     Heart sounds: Normal heart sounds. No murmur. No friction rub. No gallop.   Pulmonary:     Effort: No respiratory distress.     Breath sounds: Normal breath sounds. No wheezing or rales.  Abdominal:     General: Bowel sounds are normal.     Palpations: Abdomen is soft. There is no mass.     Tenderness: There is no abdominal tenderness. There is no guarding or rebound.  Musculoskeletal: Normal range of motion.        General: No tenderness.  Lymphadenopathy:     Cervical: No cervical adenopathy.  Skin:    General: Skin is warm.     Findings: No rash.  Neurological:     Mental Status: He is alert and oriented to person, place, and time.     Cranial Nerves: No cranial nerve deficit.     Deep Tendon Reflexes: Reflexes are normal and symmetric.     Wt Readings from Last 3 Encounters:  01/10/19 191 lb (86.6 kg)  02/14/18 201 lb (91.2 kg)  11/16/17 190 lb (86.2 kg)    BP 120/70   Pulse 76   Ht 5\' 8"  (1.727 m)   Wt 191 lb (86.6 kg)   BMI 29.04 kg/m   Assessment and Plan:  1. Recurrent cold sores Patient with recurrent canker sores which have been treated with prednisone in the past will refill 10 mg by mouth daily. - predniSONE (DELTASONE) 10 MG tablet; Take 1 tablet (10 mg total) by mouth daily with breakfast.  Dispense: 30 tablet; Refill: 0  2. Vertigo She with a history of vertigo requiring ENT involvement, meclizine, Epley maneuver exercises.  He said worked in the past but these have not totally resolved the case patient is in the process of getting ready to go to the mountains we will add Flonase.  Refill of his meclizine since it was out of date, and prednisone for his cold sores may help with this as well. - meclizine (ANTIVERT) 25 MG tablet; Take 1 tablet (25 mg total) by mouth 3  (three) times daily as needed for dizziness.  Dispense: 30 tablet; Refill: 0  3. Eustachian tube dysfunction, left Vernard Gambles is eustachian tube dysfunction manifested by bilateral panic membrane retraction.  Will initiate the above regimen along with Sudafed to open up the eustachian tubes and equalize pressure prior to trip to the mountains. - fluticasone (FLONASE) 50 MCG/ACT nasal spray; Place 2 sprays into both nostrils daily.  Dispense: 16 g; Refill: 6

## 2019-04-21 ENCOUNTER — Ambulatory Visit: Payer: 59 | Admitting: Family Medicine

## 2019-04-21 ENCOUNTER — Encounter: Payer: Self-pay | Admitting: Family Medicine

## 2019-04-21 ENCOUNTER — Other Ambulatory Visit: Payer: Self-pay

## 2019-04-21 VITALS — BP 120/78 | HR 72 | Ht 68.0 in | Wt 198.0 lb

## 2019-04-21 DIAGNOSIS — Z23 Encounter for immunization: Secondary | ICD-10-CM

## 2019-04-21 DIAGNOSIS — R0789 Other chest pain: Secondary | ICD-10-CM | POA: Diagnosis not present

## 2019-04-21 MED ORDER — ASPIRIN EC 81 MG PO TBEC: 81.0000 mg | DELAYED_RELEASE_TABLET | Freq: Every day | ORAL | 1 refills | Status: DC

## 2019-04-21 NOTE — Progress Notes (Signed)
Date:  04/21/2019   Name:  Dustin Morse.   DOB:  24-May-1975   MRN:  [id number]   Chief Complaint: Chest Pain and Flu Vaccine  chest  Chest Pain  This is a new problem. The current episode started in the past 7 days. The onset quality is gradual. The problem occurs daily (20-30 minutes). Progression since onset: intermitant. The pain is present in the substernal region (left of sternun/left pectoral). The pain is moderate. The quality of the pain is described as dull ("strain" "dull ache"). The pain does not radiate. Pertinent negatives include no abdominal pain, back pain, cough, diaphoresis, dizziness, exertional chest pressure, fever, headaches, hemoptysis, irregular heartbeat, nausea, orthopnea, palpitations or shortness of breath. Associated with: one arm (left) push up. Treatments tried: resting few  minutes. The treatment provided mild relief.  His family medical history is significant for CAD and hyperlipidemia. Family history comments: dad mi late 64's     No results found for: CREATININE, BUN, NA, K, CL, CO2 No results found for: CHOL, HDL, LDLCALC, LDLDIRECT, TRIG, CHOLHDL No results found for: TSH No results found for: HGBA1C   Review of Systems  Constitutional: Negative for chills, diaphoresis and fever.  HENT: Negative for drooling, ear discharge, ear pain and sore throat.   Respiratory: Negative for apnea, cough, hemoptysis, chest tightness, shortness of breath and wheezing.   Cardiovascular: Positive for chest pain. Negative for palpitations, orthopnea and leg swelling.  Gastrointestinal: Negative for abdominal pain, blood in stool, constipation, diarrhea and nausea.  Endocrine: Negative for polydipsia.  Genitourinary: Negative for dysuria, frequency, hematuria and urgency.  Musculoskeletal: Negative for back pain, myalgias and neck pain.  Skin: Negative for rash.  Allergic/Immunologic: Negative for environmental allergies.  Neurological: Negative for dizziness  and headaches.  Hematological: Does not bruise/bleed easily.  Psychiatric/Behavioral: Negative for suicidal ideas. The patient is not nervous/anxious.     Patient Active Problem List   Diagnosis Date Noted  . Hx of labyrinthitis 07/15/2017  . 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 COLONOSCOPY  2008   cleared for 10 yrs- Dr Servando Snare    Social History   Tobacco Use  . Smoking status: Never Smoker  . Smokeless tobacco: Never Used  Substance Use Topics  . Alcohol use: Yes    Alcohol/week: 0.0 standard drinks  . Drug use: No     Medication list has been reviewed and updated.  Current Meds  Medication Sig  . acetaminophen (TYLENOL) 500 MG tablet Take 500 mg by mouth every 6 (six) hours as needed.    PHQ 2/9 Scores 04/05/2017 09/09/2015  PHQ - 2 Score 0 0    BP Readings from Last 3 Encounters:  04/21/19 120/78  01/10/19 120/70  02/14/18 122/80    Physical Exam Vitals and nursing note reviewed.  HENT:     Head: Normocephalic.     Right Ear: External ear normal.     Left Ear: External ear normal.     Nose: Nose normal.  Eyes:     General: No scleral icterus.       Right eye: No discharge.        Left eye: No discharge.     Conjunctiva/sclera: Conjunctivae normal.     Pupils: Pupils are equal, round, and reactive to light.  Neck:     Thyroid: No thyromegaly.     Vascular: No JVD.     Trachea:  No tracheal deviation.  Cardiovascular:     Rate and Rhythm: Normal rate and regular rhythm.     Chest Wall: PMI is not displaced.     Pulses: Normal pulses.          Carotid pulses are 2+ on the right side and 2+ on the left side.      Radial pulses are 2+ on the right side and 2+ on the left side.       Femoral pulses are 2+ on the right side and 2+ on the left side.      Popliteal pulses are 2+ on the right side and 2+ on the left side.       Dorsalis pedis pulses are 2+ on the right side and 2+ on  the left side.       Posterior tibial pulses are 2+ on the right side and 2+ on the left side.     Heart sounds: Normal heart sounds, S1 normal and S2 normal. No murmur. No systolic murmur. No diastolic murmur. No friction rub. No gallop. No S3 or S4 sounds.   Pulmonary:     Effort: No respiratory distress.     Breath sounds: Normal breath sounds. No wheezing or rales.  Abdominal:     General: Bowel sounds are normal.     Palpations: Abdomen is soft. There is no mass.     Tenderness: There is no abdominal tenderness. There is no guarding or rebound.  Musculoskeletal:        General: No tenderness. Normal range of motion.     Cervical back: Normal range of motion and neck supple.     Right lower leg: No edema.     Left lower leg: No edema.  Lymphadenopathy:     Cervical: No cervical adenopathy.  Skin:    General: Skin is warm.     Findings: No rash.  Neurological:     Mental Status: He is alert and oriented to person, place, and time.     Cranial Nerves: No cranial nerve deficit.     Deep Tendon Reflexes: Reflexes are normal and symmetric.     Wt Readings from Last 3 Encounters:  04/21/19 198 lb (89.8 kg)  01/10/19 191 lb (86.6 kg)  02/14/18 201 lb (91.2 kg)    BP 120/78   Pulse 72   Ht 5\' 8"  (1.727 m)   Wt 198 lb (89.8 kg)   BMI 30.11 kg/m   Assessment and Plan: 1. Other chest pain New onset.  Episodic.  Stable.  Patient has substernal chest discomfort that started about a week ago.  It is described as a dull ache.  It is not necessarily associated with exercise and he does weightlifting.  He does have point tenderness on the lateral left pectoral but this is different and can last for a few minutes up to 10 minutes.  On questioning patient does have a family history that his father had a myocardial infarction in his late 37s early 22s.  Patient does not have risk factors of hypertension or hyperlipidemia but is overweight.  EKG was done.I have reviewed EKG which shows rate  85 and in sinus rhythm.  Intervals are normal there is no evidence of increased voltage to suggest hypertrophy.  There is no ischemic changes such as Q waves, ST or T wave segment abnormalities, nor poor progression of R waves.  Essentially this is a normal EKG.. Comparison to previous EKG dated 2019 and there is no  change from previous EKG.  Given the family history and patient's personal history we will refer to cardiology for evaluation and further treatment.  In the meantime I have requested the patient began low-dose aspirin 81 mg enteric-coated until evaluation. - EKG 12-Lead - Ambulatory referral to Cardiology  2. Influenza vaccine needed Discussed and administered - Flu Vaccine QUAD 6+ mos PF IM (Fluarix Quad PF)

## 2019-06-12 ENCOUNTER — Ambulatory Visit: Payer: 59 | Admitting: Family Medicine

## 2019-06-12 ENCOUNTER — Encounter: Payer: Self-pay | Admitting: Family Medicine

## 2019-06-12 ENCOUNTER — Other Ambulatory Visit: Payer: Self-pay

## 2019-06-12 VITALS — BP 120/64 | HR 72 | Ht 68.0 in | Wt 197.0 lb

## 2019-06-12 DIAGNOSIS — J0100 Acute maxillary sinusitis, unspecified: Secondary | ICD-10-CM | POA: Diagnosis not present

## 2019-06-12 DIAGNOSIS — K2901 Acute gastritis with bleeding: Secondary | ICD-10-CM | POA: Diagnosis not present

## 2019-06-12 DIAGNOSIS — B001 Herpesviral vesicular dermatitis: Secondary | ICD-10-CM | POA: Diagnosis not present

## 2019-06-12 DIAGNOSIS — H6982 Other specified disorders of Eustachian tube, left ear: Secondary | ICD-10-CM

## 2019-06-12 DIAGNOSIS — J301 Allergic rhinitis due to pollen: Secondary | ICD-10-CM | POA: Diagnosis not present

## 2019-06-12 MED ORDER — FLUTICASONE PROPIONATE 50 MCG/ACT NA SUSP: 2.0000 | Freq: Every day | NASAL | 6 refills | Status: DC

## 2019-06-12 MED ORDER — AMOXICILLIN 500 MG PO CAPS: 500.0000 mg | ORAL_CAPSULE | Freq: Three times a day (TID) | ORAL | 0 refills | Status: DC

## 2019-06-12 MED ORDER — PREDNISONE 10 MG PO TABS: 10.0000 mg | ORAL_TABLET | Freq: Every day | ORAL | 0 refills | Status: DC

## 2019-06-12 MED ORDER — PANTOPRAZOLE SODIUM 40 MG PO TBEC: 40.0000 mg | DELAYED_RELEASE_TABLET | Freq: Every day | ORAL | 3 refills | Status: DC

## 2019-06-12 NOTE — Progress Notes (Signed)
Date:  06/12/2019   Name:  Dustin Morse.   DOB:  1976/01/02   MRN:  [id number]   Chief Complaint: Sinusitis (scrathcy throat, itchy ears, post nasal drainage- clear production) and Mouth Lesions (needs refill on pred to use as needed for cold sores)  Sinusitis This is a new problem. The current episode started in the past 7 days. The problem has been gradually worsening since onset. There has been no fever. The pain is mild. Associated symptoms include congestion, sinus pressure and a sore throat. Pertinent negatives include no chills, coughing, diaphoresis, ear pain, headaches, hoarse voice, neck pain, shortness of breath, sneezing or swollen glands. Past treatments include nothing.  Mouth Lesions  Episode onset: episodic. The onset was gradual. The problem has been unchanged. The problem is mild. Associated symptoms include congestion, mouth sores and sore throat. Pertinent negatives include no fever, no abdominal pain, no constipation, no diarrhea, no nausea, no ear discharge, no ear pain, no headaches, no swollen glands, no neck pain, no cough, no wheezing and no rash.  Gastroesophageal Reflux He complains of a sore throat. He reports no abdominal pain, no belching, no chest pain, no choking, no coughing, no dysphagia, no globus sensation, no heartburn, no hoarse voice, no nausea or no wheezing. This is a chronic problem. The current episode started more than 1 year ago. The problem has been gradually improving. The symptoms are aggravated by certain foods.    No results found for: CREATININE, BUN, NA, K, CL, CO2 No results found for: CHOL, HDL, LDLCALC, LDLDIRECT, TRIG, CHOLHDL No results found for: TSH No results found for: HGBA1C   Review of Systems  Constitutional: Negative for chills, diaphoresis and fever.  HENT: Positive for congestion, mouth sores, sinus pressure and sore throat. Negative for drooling, ear discharge, ear pain, hoarse voice and sneezing.   Respiratory:  Negative for cough, choking, shortness of breath and wheezing.   Cardiovascular: Negative for chest pain, palpitations and leg swelling.  Gastrointestinal: Negative for abdominal pain, blood in stool, constipation, diarrhea, dysphagia, heartburn and nausea.  Endocrine: Negative for polydipsia.  Genitourinary: Negative for dysuria, frequency, hematuria and urgency.  Musculoskeletal: Negative for back pain, myalgias and neck pain.  Skin: Negative for rash.  Allergic/Immunologic: Negative for environmental allergies.  Neurological: Negative for dizziness and headaches.  Hematological: Does not bruise/bleed easily.  Psychiatric/Behavioral: Negative for suicidal ideas. The patient is not nervous/anxious.     Patient Active Problem List   Diagnosis Date Noted  . Hx of labyrinthitis 07/15/2017  . 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 COLONOSCOPY  2008   cleared for 10 yrs- Dr Servando Snare    Social History   Tobacco Use  . Smoking status: Never Smoker  . Smokeless tobacco: Never Used  Substance Use Topics  . Alcohol use: Yes    Alcohol/week: 0.0 standard drinks  . Drug use: No     Medication list has been reviewed and updated.  Current Meds  Medication Sig  . acetaminophen (TYLENOL) 500 MG tablet Take 500 mg by mouth every 6 (six) hours as needed.  Marland Kitchen aspirin EC 81 MG tablet Take 1 tablet (81 mg total) by mouth daily.  . fluticasone (FLONASE) 50 MCG/ACT nasal spray Place 2 sprays into both nostrils daily.  . pantoprazole (PROTONIX) 40 MG tablet Take 1 tablet (40 mg total) by mouth daily.    PHQ 2/9 Scores 06/12/2019 04/05/2017 09/09/2015  PHQ - 2 Score 0 0 0  PHQ- 9 Score 0 - -    BP Readings from Last 3 Encounters:  06/12/19 120/64  04/21/19 120/78  01/10/19 120/70    Physical Exam Vitals and nursing note reviewed.  HENT:     Head: Normocephalic.     Jaw: There is normal jaw occlusion. No tenderness.      Salivary Glands: Right salivary gland is not diffusely enlarged or tender. Left salivary gland is not diffusely enlarged or tender.     Right Ear: Hearing, ear canal and external ear normal. Tympanic membrane is retracted.     Left Ear: Hearing, ear canal and external ear normal. Tympanic membrane is retracted.     Nose: Nose normal.     Mouth/Throat:     Lips: Pink.     Mouth: Mucous membranes are moist.     Palate: No mass and lesions.     Pharynx: Oropharynx is clear. Uvula midline. Posterior oropharyngeal erythema present. No pharyngeal swelling or oropharyngeal exudate.  Eyes:     General: No scleral icterus.       Right eye: No discharge.        Left eye: No discharge.     Conjunctiva/sclera: Conjunctivae normal.     Pupils: Pupils are equal, round, and reactive to light.  Neck:     Thyroid: No thyromegaly.     Vascular: No JVD.     Trachea: No tracheal deviation.  Cardiovascular:     Rate and Rhythm: Normal rate and regular rhythm.     Heart sounds: Normal heart sounds. No murmur. No friction rub. No gallop.   Pulmonary:     Effort: No respiratory distress.     Breath sounds: Normal breath sounds. No wheezing or rales.  Abdominal:     General: Bowel sounds are normal.     Palpations: Abdomen is soft. There is no mass.     Tenderness: There is no abdominal tenderness. There is no guarding or rebound.  Musculoskeletal:        General: No tenderness. Normal range of motion.     Cervical back: Normal range of motion and neck supple.  Lymphadenopathy:     Cervical: No cervical adenopathy.  Skin:    General: Skin is warm.     Findings: No rash.  Neurological:     Mental Status: He is alert and oriented to person, place, and time.     Cranial Nerves: No cranial nerve deficit.     Deep Tendon Reflexes: Reflexes are normal and symmetric.     Wt Readings from Last 3 Encounters:  06/12/19 197 lb (89.4 kg)  04/21/19 198 lb (89.8 kg)  01/10/19 191 lb (86.6 kg)    BP  120/64   Pulse 72   Ht 5\' 8"  (1.727 m)   Wt 197 lb (89.4 kg)   BMI 29.95 kg/m   Assessment and Plan: 1. Acute maxillary sinusitis, recurrence not specified Acute.  Persistent.  Patient with new onset symptoms suggesting patient has a maxillary sinus infection.  Will initiate amoxicillin 500 mg 3 times a day for 10 days. - amoxicillin (AMOXIL) 500 MG capsule; Take 1 capsule (500 mg total) by mouth 3 (three) times daily.  Dispense: 30 capsule; Refill: 0  2. Recurrent cold sores Recurrent.  Chronic.  Stable.  Patient usually resolves with 1 to 2 days of prednisone 10 mg 1 to 2 tablets daily.- predniSONE (DELTASONE) 10 MG tablet; Take 1 tablet (10 mg  total) by mouth daily with breakfast.  Dispense: 30 tablet; Refill: 0  3. Seasonal allergic rhinitis due to pollen Seasonal allergic rhinitis due to pollen.  Patient had symptomatology after removing a dust cover from some equipment.  We will reinitiate fluticasone nasal spray 1 puff in each nostril twice a day. - fluticasone (FLONASE) 50 MCG/ACT nasal spray; Place 2 sprays into both nostrils daily.  Dispense: 16 g; Refill: 6  4. Acute gastritis with hemorrhage, unspecified gastritis type Patient with history of reflux.  Chronic.  Controlled.  Stable.  Will continue pantoprazole 40 mg once a day. - pantoprazole (PROTONIX) 40 MG tablet; Take 1 tablet (40 mg total) by mouth daily.  Dispense: 30 tablet; Refill: 3  5. Eustachian tube dysfunction, left Eustachian tubes were noted to be retracted and this will treat with fluticasone nasal spray for suspected allergic rhinitis. - fluticasone (FLONASE) 50 MCG/ACT nasal spray; Place 2 sprays into both nostrils daily.  Dispense: 16 g; Refill: 6

## 2019-07-05 ENCOUNTER — Other Ambulatory Visit: Payer: Self-pay

## 2019-07-05 DIAGNOSIS — J0100 Acute maxillary sinusitis, unspecified: Secondary | ICD-10-CM

## 2019-07-05 MED ORDER — AZITHROMYCIN 250 MG PO TABS: ORAL_TABLET | ORAL | 0 refills | Status: DC

## 2019-10-27 ENCOUNTER — Other Ambulatory Visit: Payer: Self-pay | Admitting: Family Medicine

## 2019-10-27 DIAGNOSIS — K2901 Acute gastritis with bleeding: Secondary | ICD-10-CM

## 2019-10-27 MED ORDER — PANTOPRAZOLE SODIUM 40 MG PO TBEC: 40.0000 mg | DELAYED_RELEASE_TABLET | Freq: Every day | ORAL | 3 refills | Status: DC

## 2019-10-27 NOTE — Telephone Encounter (Signed)
Medication: pantoprazole (PROTONIX) 40 MG tablet [[id number]]   Has the patient contacted their pharmacy? Yes  (Agent: If no, request that the patient contact the pharmacy for the refill.) (Agent: If yes, when and what did the pharmacy advise?)  Preferred Pharmacy (with phone number or street name):  [id number],  - [address] AT [facility] [address] [facility] [address]  Phone:  [phone] Fax:  [phone]    Agent: Please be advised that RX refills may take up to 3 business days. We ask that you follow-up with your pharmacy.

## 2020-01-29 ENCOUNTER — Ambulatory Visit: Payer: Self-pay

## 2020-01-29 ENCOUNTER — Ambulatory Visit: Payer: Self-pay | Admitting: Family Medicine

## 2020-01-29 NOTE — Telephone Encounter (Signed)
Patient called stating that his ears are burning and itchy.  They crack and pop. 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 He states that last week he had some vertigo associated with the ears. He has no nasal congestion. No fever. No cough. He did take some meclizine for his vertigo last week and states he need ne RX. Per protocol Virtual appointment scheduled today.  Care advice read to patient. He verbalized understanding.  Reason for Disposition . Ear congestion present > 48 hours  Answer Assessment - Initial Assessment Questions 1. LOCATION: "Which ear is involved?"       Left first and now right 2. SENSATION: "Describe how the ear feels." (e.g. stuffy, full, plugged)."      Itchy burning 3. ONSET:  "When did the ear symptoms start?"      Last week 4. PAIN: "Do you also have an earache?" If Yes, ask: "How bad is it?" (Scale 1-10; or mild, moderate, severe)     none 5. CAUSE: "What do you think is causing the ear congestion?"     Ear infection 6. URI: "Do you have a runny nose or cough?"      no 7. NASAL ALLERGIES: "Are there symptoms of hay fever, such as sneezing or a clear nasal discharge?"     Sinusitis taking alegra 8. PREGNANCY: "Is there any chance you are pregnant?" "When was your last menstrual period?"     N/A  Protocols used: EAR - CONGESTION-A-AH

## 2020-01-30 ENCOUNTER — Encounter: Payer: Self-pay | Admitting: Family Medicine

## 2020-01-30 ENCOUNTER — Ambulatory Visit: Payer: 59 | Admitting: Family Medicine

## 2020-01-30 ENCOUNTER — Other Ambulatory Visit: Payer: Self-pay

## 2020-01-30 VITALS — BP 130/80 | HR 68 | Ht 68.0 in | Wt 197.0 lb

## 2020-01-30 DIAGNOSIS — R42 Dizziness and giddiness: Secondary | ICD-10-CM

## 2020-01-30 DIAGNOSIS — B001 Herpesviral vesicular dermatitis: Secondary | ICD-10-CM | POA: Diagnosis not present

## 2020-01-30 DIAGNOSIS — J0100 Acute maxillary sinusitis, unspecified: Secondary | ICD-10-CM | POA: Diagnosis not present

## 2020-01-30 MED ORDER — PREDNISONE 10 MG PO TABS: 10.0000 mg | ORAL_TABLET | Freq: Every day | ORAL | 0 refills | Status: DC

## 2020-01-30 MED ORDER — AMOXICILLIN 500 MG PO CAPS: 500.0000 mg | ORAL_CAPSULE | Freq: Three times a day (TID) | ORAL | 0 refills | Status: DC

## 2020-01-30 MED ORDER — MECLIZINE HCL 25 MG PO TABS: 25.0000 mg | ORAL_TABLET | Freq: Three times a day (TID) | ORAL | 0 refills | Status: DC | PRN

## 2020-01-30 NOTE — Progress Notes (Signed)
Date:  01/30/2020   Name:  Dustin Morse.   DOB:  02/20/1976   MRN:  [id number]   Chief Complaint: Dizziness (was folding clothes last week and got dizzy. Ears have been popping and crackling, congestion- thinks he has an ear infection coming on) and fever blisters (needs refill on prednisone)  Otalgia  There is pain in both ears. This is a new problem. The current episode started in the past 7 days. The problem occurs every few minutes. The problem has been waxing and waning. There has been no fever. Pertinent negatives include no abdominal pain, coughing, diarrhea, ear discharge, headaches, hearing loss, neck pain, rash, rhinorrhea, sore throat or vomiting. Treatments tried: allegra d. The treatment provided mild relief. There is no history of a chronic ear infection or hearing loss.  Sinusitis This is a new problem. The current episode started in the past 7 days. The problem has been waxing and waning since onset. The pain is mild. Associated symptoms include congestion, ear pain and sinus pressure. Pertinent negatives include no chills, coughing, diaphoresis, headaches, hoarse voice, neck pain, shortness of breath, sneezing, sore throat or swollen glands. Past treatments include oral decongestants. The treatment provided mild relief.  Dizziness This is a chronic problem. The current episode started more than 1 year ago. The problem occurs daily. The problem has been gradually worsening. Associated symptoms include congestion. Pertinent negatives include no abdominal pain, anorexia, arthralgias, change in bowel habit, chest pain, chills, coughing, diaphoresis, fatigue, fever, headaches, joint swelling, myalgias, nausea, neck pain, numbness, rash, sore throat, swollen glands, urinary symptoms or vomiting.    No results found for: CREATININE, BUN, NA, K, CL, CO2 No results found for: CHOL, HDL, LDLCALC, LDLDIRECT, TRIG, CHOLHDL No results found for: TSH No results found for: HGBA1C Lab  Results  Component Value Date   HGB 15.1 11/16/2017   No results found for: ALT, AST, GGT, ALKPHOS, BILITOT   Review of Systems  Constitutional: Negative for chills, diaphoresis, fatigue and fever.  HENT: Positive for congestion, ear pain and sinus pressure. Negative for drooling, ear discharge, hearing loss, hoarse voice, rhinorrhea, sneezing and sore throat.   Respiratory: Negative for cough, shortness of breath and wheezing.   Cardiovascular: Negative for chest pain, palpitations and leg swelling.  Gastrointestinal: Negative for abdominal pain, anorexia, blood in stool, change in bowel habit, constipation, diarrhea, nausea and vomiting.  Endocrine: Negative for polydipsia.  Genitourinary: Negative for dysuria, frequency, hematuria and urgency.  Musculoskeletal: Negative for arthralgias, back pain, joint swelling, myalgias and neck pain.  Skin: Negative for rash.  Allergic/Immunologic: Negative for environmental allergies.  Neurological: Positive for dizziness. Negative for numbness and headaches.  Hematological: Does not bruise/bleed easily.  Psychiatric/Behavioral: Negative for suicidal ideas. The patient is not nervous/anxious.     Patient Active Problem List   Diagnosis Date Noted  . Hx of labyrinthitis 07/15/2017  . 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 COLONOSCOPY  2008   cleared for 10 yrs- Dr Servando Snare    Social History   Tobacco Use  . Smoking status: Never Smoker  . Smokeless tobacco: Never Used  Substance Use Topics  . Alcohol use: Yes    Alcohol/week: 0.0 standard drinks  . Drug use: No     Medication list has been reviewed and updated.  Current Meds  Medication Sig  . acetaminophen (TYLENOL) 500 MG tablet Take 500 mg by mouth every  6 (six) hours as needed.  . fluticasone (FLONASE) 50 MCG/ACT nasal spray Place 2 sprays into both nostrils daily.  . meclizine (ANTIVERT) 25 MG tablet  Take 1 tablet (25 mg total) by mouth 3 (three) times daily as needed for dizziness.  . pantoprazole (PROTONIX) 40 MG tablet Take 1 tablet (40 mg total) by mouth daily.  . [DISCONTINUED] aspirin EC 81 MG tablet Take 1 tablet (81 mg total) by mouth daily.    PHQ 2/9 Scores 01/30/2020 06/12/2019 04/05/2017 09/09/2015  PHQ - 2 Score 0 0 0 0  PHQ- 9 Score 0 0 - -    GAD 7 : Generalized Anxiety Score 01/30/2020 06/12/2019  Nervous, Anxious, on Edge 0 0  Control/stop worrying 0 0  Worry too much - different things 0 0  Trouble relaxing 0 0  Restless 0 0  Easily annoyed or irritable 0 0  Afraid - awful might happen 0 0  Total GAD 7 Score 0 0    BP Readings from Last 3 Encounters:  01/30/20 130/80  06/12/19 120/64  04/21/19 120/78    Physical Exam Vitals and nursing note reviewed.  HENT:     Head: Normocephalic.     Jaw: There is normal jaw occlusion.     Right Ear: External ear normal. Tympanic membrane is retracted.     Left Ear: External ear normal. Tympanic membrane is retracted.     Nose: Congestion present. No rhinorrhea.     Right Turbinates: Swollen.     Left Turbinates: Swollen.     Right Sinus: Maxillary sinus tenderness present.     Left Sinus: Maxillary sinus tenderness present.     Mouth/Throat:     Pharynx: Oropharynx is clear.  Eyes:     General: No scleral icterus.       Right eye: No discharge.        Left eye: No discharge.     Conjunctiva/sclera: Conjunctivae normal.     Pupils: Pupils are equal, round, and reactive to light.  Neck:     Thyroid: No thyromegaly.     Vascular: No JVD.     Trachea: No tracheal deviation.  Cardiovascular:     Rate and Rhythm: Normal rate and regular rhythm.     Heart sounds: Normal heart sounds. No murmur heard.  No friction rub. No gallop.   Pulmonary:     Effort: No respiratory distress.     Breath sounds: Normal breath sounds. No decreased breath sounds, wheezing, rhonchi or rales.  Abdominal:     General: Bowel sounds  are normal.     Palpations: Abdomen is soft. There is no mass.     Tenderness: There is no abdominal tenderness. There is no guarding or rebound.  Musculoskeletal:        General: No tenderness. Normal range of motion.     Cervical back: Normal range of motion and neck supple.  Lymphadenopathy:     Cervical: No cervical adenopathy.  Skin:    General: Skin is warm.     Findings: No rash.  Neurological:     Mental Status: He is alert and oriented to person, place, and time.     Cranial Nerves: No cranial nerve deficit.     Deep Tendon Reflexes: Reflexes are normal and symmetric.     Wt Readings from Last 3 Encounters:  01/30/20 197 lb (89.4 kg)  06/12/19 197 lb (89.4 kg)  04/21/19 198 lb (89.8 kg)    BP 130/80  Pulse 68   Ht 5\' 8"  (1.727 m)   Wt 197 lb (89.4 kg)   BMI 29.95 kg/m     Assessment and Plan: 1. Acute maxillary sinusitis, recurrence not specified Onset. Persistent. Exam and history is consistent with a sinus infection. Will initiate amoxicillin 500 mg 3 times a day. Patient also has retraction of his panic membranes which is consistent with eustachian tube dysfunction has been suggested to continue Allegra-D.- amoxicillin (AMOXIL) 500 MG capsule; Take 1 capsule (500 mg total) by mouth 3 (three) times daily.  Dispense: 30 capsule; Refill: 0  2. Recurrent cold sores Episodic. Stable. Patient has more like canker sores that he needs to treat with prednisone 10 mg as needed. - predniSONE (DELTASONE) 10 MG tablet; Take 1 tablet (10 mg total) by mouth daily with breakfast.  Dispense: 30 tablet; Refill: 0  3. Vertigo Patient has episodic vertigo consistent with eustachian tube dysfunction which responds to meclizine 25 mg every 8 hours as needed for dizziness. - meclizine (ANTIVERT) 25 MG tablet; Take 1 tablet (25 mg total) by mouth 3 (three) times daily as needed for dizziness.  Dispense: 30 tablet; Refill: 0

## 2020-02-25 ENCOUNTER — Other Ambulatory Visit: Payer: Self-pay | Admitting: Family Medicine

## 2020-02-25 DIAGNOSIS — K2901 Acute gastritis with bleeding: Secondary | ICD-10-CM

## 2020-06-06 ENCOUNTER — Ambulatory Visit: Payer: 59 | Admitting: Family Medicine

## 2020-06-06 ENCOUNTER — Other Ambulatory Visit: Payer: Self-pay

## 2020-06-06 ENCOUNTER — Encounter: Payer: Self-pay | Admitting: Family Medicine

## 2020-06-06 VITALS — BP 130/80 | HR 80 | Ht 68.0 in | Wt 201.0 lb

## 2020-06-06 DIAGNOSIS — H6992 Unspecified Eustachian tube disorder, left ear: Secondary | ICD-10-CM

## 2020-06-06 DIAGNOSIS — H6982 Other specified disorders of Eustachian tube, left ear: Secondary | ICD-10-CM

## 2020-06-06 DIAGNOSIS — J0100 Acute maxillary sinusitis, unspecified: Secondary | ICD-10-CM | POA: Diagnosis not present

## 2020-06-06 DIAGNOSIS — J301 Allergic rhinitis due to pollen: Secondary | ICD-10-CM

## 2020-06-06 MED ORDER — FLUTICASONE PROPIONATE 50 MCG/ACT NA SUSP: 2.0000 | Freq: Every day | NASAL | 6 refills | Status: AC

## 2020-06-06 MED ORDER — AMOXICILLIN 500 MG PO CAPS: 500.0000 mg | ORAL_CAPSULE | Freq: Three times a day (TID) | ORAL | 0 refills | Status: DC

## 2020-06-06 NOTE — Progress Notes (Signed)
Date:  06/06/2020   Name:  Dustin Morse.   DOB:  1976-01-04   MRN:  [id number]   Chief Complaint: Sinusitis (Yellow mucous drainage from nose and coming up. Sore/ scratchy throat and watery eyes. )  Sinusitis This is a new problem. The current episode started in the past 7 days. The problem has been gradually improving since onset. There has been no fever. He is experiencing no pain. Associated symptoms include congestion, coughing, sinus pressure, sneezing and a sore throat. Pertinent negatives include no chills, diaphoresis, ear pain, headaches, hoarse voice, neck pain, shortness of breath or swollen glands. Treatments tried: claritin. The treatment provided mild relief.    No results found for: CREATININE, BUN, NA, K, CL, CO2 No results found for: CHOL, HDL, LDLCALC, LDLDIRECT, TRIG, CHOLHDL No results found for: TSH No results found for: HGBA1C Lab Results  Component Value Date   HGB 15.1 11/16/2017   No results found for: ALT, AST, GGT, ALKPHOS, BILITOT   Review of Systems  Constitutional: Negative for chills, diaphoresis and fever.  HENT: Positive for congestion, sinus pressure, sneezing and sore throat. Negative for drooling, ear discharge, ear pain, hoarse voice, postnasal drip and rhinorrhea.   Respiratory: Positive for cough. Negative for shortness of breath and wheezing.   Cardiovascular: Negative for chest pain, palpitations and leg swelling.  Gastrointestinal: Negative for abdominal pain, blood in stool, constipation, diarrhea and nausea.  Endocrine: Negative for polydipsia.  Genitourinary: Negative for dysuria, frequency, hematuria and urgency.  Musculoskeletal: Negative for back pain, myalgias and neck pain.  Skin: Negative for rash.  Allergic/Immunologic: Negative for environmental allergies.  Neurological: Negative for dizziness and headaches.  Hematological: Does not bruise/bleed easily.  Psychiatric/Behavioral: Negative for suicidal ideas. The patient is  not nervous/anxious.     Patient Active Problem List   Diagnosis Date Noted  . Hx of labyrinthitis 07/15/2017  . 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 COLONOSCOPY  2008   cleared for 10 yrs- Dr Servando Snare    Social History   Tobacco Use  . Smoking status: Never Smoker  . Smokeless tobacco: Never Used  Substance Use Topics  . Alcohol use: Yes    Alcohol/week: 0.0 standard drinks  . Drug use: No     Medication list has been reviewed and updated.  Current Meds  Medication Sig  . acetaminophen (TYLENOL) 500 MG tablet Take 500 mg by mouth every 6 (six) hours as needed.  . pantoprazole (PROTONIX) 40 MG tablet TAKE 1 TABLET(40 MG) BY MOUTH DAILY    PHQ 2/9 Scores 01/30/2020 06/12/2019 04/05/2017 09/09/2015  PHQ - 2 Score 0 0 0 0  PHQ- 9 Score 0 0 - -    GAD 7 : Generalized Anxiety Score 01/30/2020 06/12/2019  Nervous, Anxious, on Edge 0 0  Control/stop worrying 0 0  Worry too much - different things 0 0  Trouble relaxing 0 0  Restless 0 0  Easily annoyed or irritable 0 0  Afraid - awful might happen 0 0  Total GAD 7 Score 0 0    BP Readings from Last 3 Encounters:  06/06/20 130/80  01/30/20 130/80  06/12/19 120/64    Physical Exam Vitals and nursing note reviewed.  HENT:     Head: Normocephalic.     Jaw: There is normal jaw occlusion.     Right Ear: Hearing, tympanic membrane, ear canal and external ear normal. There  is no impacted cerumen.     Left Ear: Hearing, tympanic membrane, ear canal and external ear normal. There is no impacted cerumen.     Nose: Nose normal. No congestion or rhinorrhea.     Mouth/Throat:     Mouth: Oropharynx is clear and moist. Mucous membranes are moist.  Eyes:     General: No scleral icterus.       Right eye: No discharge.        Left eye: No discharge.     Extraocular Movements: EOM normal.     Conjunctiva/sclera: Conjunctivae normal.     Pupils: Pupils are  equal, round, and reactive to light.  Neck:     Thyroid: No thyromegaly.     Vascular: No JVD.     Trachea: No tracheal deviation.  Cardiovascular:     Rate and Rhythm: Normal rate and regular rhythm.     Pulses: Intact distal pulses.     Heart sounds: Normal heart sounds. No murmur heard. No friction rub. No gallop.   Pulmonary:     Effort: No respiratory distress.     Breath sounds: Normal breath sounds. No wheezing or rales.  Abdominal:     General: Bowel sounds are normal.     Palpations: Abdomen is soft. There is no hepatosplenomegaly or mass.     Tenderness: There is no abdominal tenderness. There is no CVA tenderness, guarding or rebound.  Musculoskeletal:        General: No tenderness or edema. Normal range of motion.     Cervical back: Normal range of motion and neck supple.  Lymphadenopathy:     Cervical: No cervical adenopathy.  Skin:    General: Skin is warm.     Findings: No rash.  Neurological:     Mental Status: He is alert and oriented to person, place, and time.     Cranial Nerves: No cranial nerve deficit.     Deep Tendon Reflexes: Strength normal and reflexes are normal and symmetric.     Wt Readings from Last 3 Encounters:  06/06/20 201 lb (91.2 kg)  01/30/20 197 lb (89.4 kg)  06/12/19 197 lb (89.4 kg)    BP 130/80   Pulse 80   Ht 5\' 8"  (1.727 m)   Wt 201 lb (91.2 kg)   BMI 30.56 kg/m   Assessment and Plan: 1. Acute maxillary sinusitis, recurrence not specified Chronic allergic rhinitis.  Controlled.  Stable.  Patient has new onset of sinusitis with sinus pressure/congestion in purulent nasal discharge.  We will initiate amoxicillin 500 mg 3 times a day for 10 days. - amoxicillin (AMOXIL) 500 MG capsule; Take 1 capsule (500 mg total) by mouth 3 (three) times daily.  Dispense: 30 capsule; Refill: 0  2. Seasonal allergic rhinitis due to pollen Chronic.  Controlled.  Stable.  Resume fluticasone with Sudafed on a daily basis. - fluticasone (FLONASE)  50 MCG/ACT nasal spray; Place 2 sprays into both nostrils daily.  Dispense: 16 g; Refill: 6  3. Eustachian tube dysfunction, left As noted above - fluticasone (FLONASE) 50 MCG/ACT nasal spray; Place 2 sprays into both nostrils daily.  Dispense: 16 g; Refill: 6

## 2020-06-21 ENCOUNTER — Other Ambulatory Visit: Payer: Self-pay | Admitting: Family Medicine

## 2020-06-21 DIAGNOSIS — K2901 Acute gastritis with bleeding: Secondary | ICD-10-CM

## 2020-07-21 ENCOUNTER — Other Ambulatory Visit: Payer: Self-pay | Admitting: Family Medicine

## 2020-07-21 DIAGNOSIS — K2901 Acute gastritis with bleeding: Secondary | ICD-10-CM

## 2020-07-21 NOTE — Telephone Encounter (Signed)
Pt due office visit for refill of pantoprazole. Last refill was a courtesy RF on 06/21/20 #30. Last OV addressing issue 06/12/19. Called pt but unable to LM per [employer] assistant- "pt unable to take call." Routing to [facility].

## 2020-07-25 ENCOUNTER — Ambulatory Visit: Payer: 59 | Admitting: Family Medicine

## 2020-07-25 ENCOUNTER — Other Ambulatory Visit: Payer: Self-pay

## 2020-07-25 ENCOUNTER — Encounter: Payer: Self-pay | Admitting: Family Medicine

## 2020-07-25 VITALS — BP 130/94 | HR 60 | Ht 68.0 in | Wt 198.0 lb

## 2020-07-25 DIAGNOSIS — K2100 Gastro-esophageal reflux disease with esophagitis, without bleeding: Secondary | ICD-10-CM | POA: Diagnosis not present

## 2020-07-25 DIAGNOSIS — M7051 Other bursitis of knee, right knee: Secondary | ICD-10-CM

## 2020-07-25 DIAGNOSIS — K2901 Acute gastritis with bleeding: Secondary | ICD-10-CM

## 2020-07-25 MED ORDER — PANTOPRAZOLE SODIUM 40 MG PO TBEC: DELAYED_RELEASE_TABLET | ORAL | 0 refills | Status: DC

## 2020-07-25 MED ORDER — CELECOXIB 400 MG PO CAPS: 400.0000 mg | ORAL_CAPSULE | Freq: Every day | ORAL | 0 refills | Status: DC

## 2020-07-25 NOTE — Progress Notes (Signed)
Date:  07/25/2020   Name:  Dustin Morse.   DOB:  04/27/1975   MRN:  [id number]   Chief Complaint: Gastroesophageal Reflux and Knee Pain (R) knee pain when kneels down)  Gastroesophageal Reflux He reports no abdominal pain, no belching, no chest pain, no choking, no coughing, no dysphagia, no early satiety, no globus sensation, no heartburn, no hoarse voice, no nausea, no sore throat, no stridor, no tooth decay, no water brash or no wheezing. This is a chronic problem. The current episode started more than 1 year ago. The problem occurs occasionally. The problem has been waxing and waning. Nothing aggravates the symptoms. Pertinent negatives include no anemia, fatigue, melena, muscle weakness, orthopnea or weight loss. He has tried a PPI for the symptoms. The treatment provided moderate relief.  Knee Pain  Incident onset: slipped twisted knee. There was no injury mechanism. The pain is present in the right knee. The quality of the pain is described as aching. The pain is at a severity of 8/10. The pain is moderate. The pain has been constant since onset. Pertinent negatives include no inability to bear weight, loss of motion, loss of sensation, muscle weakness, numbness or tingling. The symptoms are aggravated by palpation (kneeling). He has tried NSAIDs for the symptoms.    No results found for: CREATININE, BUN, NA, K, CL, CO2 No results found for: CHOL, HDL, LDLCALC, LDLDIRECT, TRIG, CHOLHDL No results found for: TSH No results found for: HGBA1C Lab Results  Component Value Date   HGB 15.1 11/16/2017   No results found for: ALT, AST, GGT, ALKPHOS, BILITOT   Review of Systems  Constitutional: Negative for chills, fatigue, fever and weight loss.  HENT: Negative for drooling, ear discharge, ear pain, hoarse voice and sore throat.   Respiratory: Negative for cough, choking, shortness of breath and wheezing.   Cardiovascular: Negative for chest pain, palpitations and leg swelling.   Gastrointestinal: Negative for abdominal pain, blood in stool, constipation, diarrhea, dysphagia, heartburn, melena and nausea.  Endocrine: Negative for polydipsia.  Genitourinary: Negative for dysuria, frequency, hematuria and urgency.  Musculoskeletal: Negative for back pain, myalgias, muscle weakness and neck pain.  Skin: Negative for rash.  Allergic/Immunologic: Negative for environmental allergies.  Neurological: Negative for dizziness, tingling, numbness and headaches.  Hematological: Does not bruise/bleed easily.  Psychiatric/Behavioral: Negative for suicidal ideas. The patient is not nervous/anxious.     Patient Active Problem List   Diagnosis Date Noted  . Hx of labyrinthitis 07/15/2017  . 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 COLONOSCOPY  2008   cleared for 10 yrs- Dr Servando Snare    Social History   Tobacco Use  . Smoking status: Never Smoker  . Smokeless tobacco: Never Used  Substance Use Topics  . Alcohol use: Yes    Alcohol/week: 0.0 standard drinks  . Drug use: No     Medication list has been reviewed and updated.  Current Meds  Medication Sig  . acetaminophen (TYLENOL) 500 MG tablet Take 500 mg by mouth every 6 (six) hours as needed.  . fluticasone (FLONASE) 50 MCG/ACT nasal spray Place 2 sprays into both nostrils daily.  . pantoprazole (PROTONIX) 40 MG tablet TAKE 1 TABLET(40 MG) BY MOUTH DAILY    PHQ 2/9 Scores 01/30/2020 06/12/2019 04/05/2017 09/09/2015  PHQ - 2 Score 0 0 0 0  PHQ- 9 Score 0 0 - -    GAD 7 :  Generalized Anxiety Score 01/30/2020 06/12/2019  Nervous, Anxious, on Edge 0 0  Control/stop worrying 0 0  Worry too much - different things 0 0  Trouble relaxing 0 0  Restless 0 0  Easily annoyed or irritable 0 0  Afraid - awful might happen 0 0  Total GAD 7 Score 0 0    BP Readings from Last 3 Encounters:  07/25/20 (!) 130/94  06/06/20 130/80  01/30/20 130/80     Physical Exam Vitals and nursing note reviewed.  HENT:     Head: Normocephalic.     Right Ear: Tympanic membrane and external ear normal.     Left Ear: External ear normal.     Nose: Nose normal.  Eyes:     General: No scleral icterus.       Right eye: No discharge.        Left eye: No discharge.     Conjunctiva/sclera: Conjunctivae normal.     Pupils: Pupils are equal, round, and reactive to light.  Neck:     Thyroid: No thyromegaly.     Vascular: No JVD.     Trachea: No tracheal deviation.  Cardiovascular:     Rate and Rhythm: Normal rate and regular rhythm.     Heart sounds: Normal heart sounds. No murmur heard. No friction rub. No gallop.   Pulmonary:     Effort: No respiratory distress.     Breath sounds: Normal breath sounds. No wheezing or rales.  Abdominal:     General: Bowel sounds are normal.     Palpations: Abdomen is soft. There is no hepatomegaly, splenomegaly or mass.     Tenderness: There is no abdominal tenderness. There is no guarding or rebound.  Musculoskeletal:        General: No tenderness. Normal range of motion.     Cervical back: Normal range of motion and neck supple.  Lymphadenopathy:     Cervical: No cervical adenopathy.  Skin:    General: Skin is warm.     Findings: No rash.  Neurological:     Mental Status: He is alert and oriented to person, place, and time.     Cranial Nerves: No cranial nerve deficit.     Deep Tendon Reflexes: Reflexes are normal and symmetric.     Wt Readings from Last 3 Encounters:  07/25/20 198 lb (89.8 kg)  06/06/20 201 lb (91.2 kg)  01/30/20 197 lb (89.4 kg)    BP (!) 130/94   Pulse 60   Ht 5\' 8"  (1.727 m)   Wt 198 lb (89.8 kg)   BMI 30.11 kg/m   Assessment and Plan: 1. Patellar bursitis, right New onset.  Recurrent.  Patient has point tenderness on the lateral lower near the patella tendon of the right knee.  This may be a bursa or could be given the history that he has had a patellofemoral disorder  for may be a previous injury to the retinaculum.  We will initiate Celebrex 400 mg daily and will recheck as needed.  Patient also may call in her next step may be referral to sports medicine for evaluation. - celecoxib (CELEBREX) 400 MG capsule; Take 1 capsule (400 mg total) by mouth daily after breakfast.  Dispense: 30 capsule; Refill: 0  2. Gastroesophageal reflux disease with esophagitis without hemorrhage Chronic.  Controlled.  Stable.  Continue pantoprazole 40 mg once a day. - pantoprazole (PROTONIX) 40 MG tablet; TAKE 1 TABLET(40 MG) BY MOUTH DAILY  Dispense: 30 tablet; Refill: 0  3. Acute gastritis with hemorrhage, unspecified gastritis type Patient has had a history of acute gastritis and is currently controlled on Protonix 40 mg once a day.  This is necessary if he misses more than 3 days patient has issues with gastritis symptoms. - pantoprazole (PROTONIX) 40 MG tablet; TAKE 1 TABLET(40 MG) BY MOUTH DAILY  Dispense: 30 tablet; Refill: 0

## 2020-08-24 ENCOUNTER — Other Ambulatory Visit: Payer: Self-pay | Admitting: Family Medicine

## 2020-08-24 DIAGNOSIS — K2100 Gastro-esophageal reflux disease with esophagitis, without bleeding: Secondary | ICD-10-CM

## 2020-08-24 DIAGNOSIS — K2901 Acute gastritis with bleeding: Secondary | ICD-10-CM

## 2020-10-02 ENCOUNTER — Other Ambulatory Visit: Payer: Self-pay | Admitting: Family Medicine

## 2020-10-02 DIAGNOSIS — K2100 Gastro-esophageal reflux disease with esophagitis, without bleeding: Secondary | ICD-10-CM

## 2020-10-02 DIAGNOSIS — K2901 Acute gastritis with bleeding: Secondary | ICD-10-CM

## 2020-10-02 NOTE — Telephone Encounter (Signed)
Refill/need to see for continuance.

## 2020-11-04 ENCOUNTER — Other Ambulatory Visit: Payer: Self-pay | Admitting: Family Medicine

## 2020-11-04 DIAGNOSIS — K2100 Gastro-esophageal reflux disease with esophagitis, without bleeding: Secondary | ICD-10-CM

## 2020-11-04 DIAGNOSIS — K2901 Acute gastritis with bleeding: Secondary | ICD-10-CM

## 2020-12-05 ENCOUNTER — Other Ambulatory Visit: Payer: Self-pay | Admitting: Family Medicine

## 2020-12-05 DIAGNOSIS — K2901 Acute gastritis with bleeding: Secondary | ICD-10-CM

## 2020-12-05 DIAGNOSIS — K2100 Gastro-esophageal reflux disease with esophagitis, without bleeding: Secondary | ICD-10-CM

## 2020-12-05 NOTE — Telephone Encounter (Signed)
Requested Prescriptions  Pending Prescriptions Disp Refills  . pantoprazole (PROTONIX) 40 MG tablet [Pharmacy Med Name: PANTOPRAZOLE SOD DR 40 MG TAB] 30 tablet 0    Sig: TAKE 1 TABLET BY MOUTH EVERY DAY     Gastroenterology: Proton Pump Inhibitors Passed - 12/05/2020  1:30 AM      Passed - Valid encounter within last 12 months    Recent Outpatient Visits          4 months ago Patellar bursitis, right   [facility] Duanne Limerick, MD   6 months ago Acute maxillary sinusitis, recurrence not specified   [facility] Duanne Limerick, MD   10 months ago Acute maxillary sinusitis, recurrence not specified   [facility] Duanne Limerick, MD   1 year ago Acute maxillary sinusitis, recurrence not specified   [facility] Duanne Limerick, MD   1 year ago Other chest pain   [facility] Duanne Limerick, MD

## 2021-02-14 ENCOUNTER — Telehealth: Payer: Self-pay | Admitting: Family Medicine

## 2021-02-14 ENCOUNTER — Ambulatory Visit: Payer: Self-pay

## 2021-02-14 ENCOUNTER — Other Ambulatory Visit: Payer: Self-pay

## 2021-02-14 DIAGNOSIS — B001 Herpesviral vesicular dermatitis: Secondary | ICD-10-CM

## 2021-02-14 MED ORDER — PREDNISONE 10 MG PO TABS: 10.0000 mg | ORAL_TABLET | Freq: Every day | ORAL | 0 refills | Status: DC

## 2021-02-14 NOTE — Telephone Encounter (Signed)
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 Ok per Dr Yetta Barre.

## 2021-02-14 NOTE — Telephone Encounter (Signed)
Medication Refill - Medication: predniSONE (DELTASONE) 10 MG tablet   Has the patient contacted their pharmacy? yes (Agent: If no, request that the patient contact the pharmacy for the refill. If patient does not wish to contact the pharmacy document the reason why and proceed with request.) (Agent: If yes, when and what did the pharmacy advise?)contact pcp  Preferred Pharmacy (with phone number or street name):  [facility] [phone] [address], [place] - [address] Phone:  [phone]  Fax:  [phone]     Has the patient been seen for an appointment in the last year OR does the patient have an upcoming appointment? yes  Agent: Please be advised that RX refills may take up to 3 business days. We ask that you follow-up with your pharmacy.

## 2021-02-14 NOTE — Telephone Encounter (Signed)
Patient called due a Rx request for prednisone. He says he has a canker sore on his tongue that he gets from time to time and prednisone is the only thing that he takes that will make the sore go away. He says he noticed it Tuesday night, it's painful when he's talking and eating, 6-7 pain; 3 pain when not talking. He says it affects eating, but he's drinking with no problems. He denies any other symptoms. I advised no available appointments for today. He says sometimes Dr. Yetta Barre will send it in without being seen. I called the office and spoke to [place], [facility] who says to let the patient know someone will call him back. I advised the patient, care advice given, patient verbalized understanding.  Reason for Disposition  Probable canker sore(s)  Answer Assessment - Initial Assessment Questions 1. LOCATION: "Where is the ulcer located?"      Right side of tongue 2. NUMBER: "How many ulcers are there?"      1 3. SIZE: "How large is the ulcer?"      Probably the size of a q-tip 4. SEVERITY: "Are they painful?" If Yes, ask: "How bad is it?"  (Scale 1-10; or mild, moderate, severe)  - MILD - eating  and drinking normally   - MODERATE - decreased liquid intake   - SEVERE - drinking very little      6-7 when talking; 3 not talking; Moderate  5. ONSET: "When did you first notice the ulcer?"      Tuesday night 6. RECURRENT SYMPTOM: "Have you had a mouth ulcer before?" If Yes, ask: "When was the last time?" and "What happened that time?"      Yes, last year 01/2020; take prednisone and it clears it up 7. CAUSE: "What do you think is causing the mouth ulcer?"     Canker 8. OTHER SYMPTOMS: "Do you have any other symptoms?" (e.g., fever)     No 9. PREGNANCY: "Is there any chance you are pregnant?" "When was your last menstrual period?"     N/A  Protocols used: Mouth Ulcers-A-AH

## 2021-03-21 ENCOUNTER — Other Ambulatory Visit: Payer: Self-pay

## 2021-03-21 ENCOUNTER — Ambulatory Visit (INDEPENDENT_AMBULATORY_CARE_PROVIDER_SITE_OTHER): Payer: 59 | Admitting: Family Medicine

## 2021-03-21 ENCOUNTER — Encounter: Payer: Self-pay | Admitting: Family Medicine

## 2021-03-21 VITALS — BP 120/80 | HR 80 | Ht 68.0 in | Wt 197.0 lb

## 2021-03-21 DIAGNOSIS — R42 Dizziness and giddiness: Secondary | ICD-10-CM | POA: Diagnosis not present

## 2021-03-21 DIAGNOSIS — H8111 Benign paroxysmal vertigo, right ear: Secondary | ICD-10-CM

## 2021-03-21 DIAGNOSIS — J0100 Acute maxillary sinusitis, unspecified: Secondary | ICD-10-CM | POA: Diagnosis not present

## 2021-03-21 MED ORDER — MECLIZINE HCL 25 MG PO TABS: 25.0000 mg | ORAL_TABLET | Freq: Three times a day (TID) | ORAL | 0 refills | Status: DC | PRN

## 2021-03-21 MED ORDER — AZITHROMYCIN 250 MG PO TABS: ORAL_TABLET | ORAL | 0 refills | Status: AC

## 2021-03-21 NOTE — Progress Notes (Signed)
Date:  03/21/2021   Name:  Dustin Morse.   DOB:  Aug 01, 1975   MRN:  [id number]   Chief Complaint: Dizziness (Nauseated with it. Hx of vertigo. Episode started last Tuesday, has got better, does not have any meclizine)  Dizziness This is a new problem. The current episode started in the past 7 days. The problem occurs intermittently. The problem has been gradually improving. Associated symptoms include congestion and vertigo. Pertinent negatives include no abdominal pain, anorexia, arthralgias, chest pain, chills, coughing, fatigue, fever, headaches, myalgias, nausea, neck pain, rash, sore throat or swollen glands.  Sinusitis This is a new problem. The current episode started in the past 7 days. The problem has been waxing and waning since onset. There has been no fever. Associated symptoms include congestion and sinus pressure. Pertinent negatives include no chills, coughing, ear pain, headaches, neck pain, shortness of breath, sore throat or swollen glands. Past treatments include nothing.   No results found for: NA, K, CO2, GLUCOSE, BUN, CREATININE, CALCIUM, EGFR, GFRNONAA, GLUCOSE No results found for: CHOL, HDL, LDLCALC, LDLDIRECT, TRIG, CHOLHDL No results found for: TSH No results found for: HGBA1C Lab Results  Component Value Date   HGB 15.1 11/16/2017   No results found for: ALT, AST, GGT, ALKPHOS, BILITOT No results found for: 25OHVITD2, 25OHVITD3, VD25OH   Review of Systems  Constitutional:  Negative for chills, fatigue and fever.  HENT:  Positive for congestion and sinus pressure. Negative for drooling, ear discharge, ear pain and sore throat.   Respiratory:  Negative for cough, shortness of breath and wheezing.   Cardiovascular:  Negative for chest pain, palpitations and leg swelling.  Gastrointestinal:  Negative for abdominal pain, anorexia, blood in stool, constipation, diarrhea and nausea.  Endocrine: Negative for polydipsia.  Genitourinary:  Negative for  dysuria, frequency, hematuria and urgency.  Musculoskeletal:  Negative for arthralgias, back pain, myalgias and neck pain.  Skin:  Negative for rash.  Allergic/Immunologic: Negative for environmental allergies.  Neurological:  Positive for dizziness and vertigo. Negative for headaches.  Hematological:  Does not bruise/bleed easily.  Psychiatric/Behavioral:  Negative for suicidal ideas. The patient is not nervous/anxious.    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  Current Meds  Medication Sig   acetaminophen (TYLENOL) 500 MG tablet Take 500 mg by mouth every 6 (six) hours as needed.   fexofenadine (ALLEGRA) 60 MG tablet Take 60 mg by mouth 2 (two) times daily. otc   fluticasone (FLONASE) 50 MCG/ACT nasal spray Place 2 sprays into both nostrils daily.   Multiple Vitamin (ONE-A-DAY MENS PO) Take 1 tablet by mouth daily.   omeprazole (PRILOSEC) 20 MG capsule Take 20 mg by mouth daily. otc   saw palmetto 500 MG capsule Take 500 mg by mouth daily.   vitamin C (ASCORBIC ACID) 500 MG tablet Take 500 mg by mouth daily.    PHQ 2/9 Scores 03/21/2021 01/30/2020 06/12/2019 04/05/2017  PHQ - 2 Score 0  0 0 0  PHQ- 9 Score 0 0 0 -    GAD 7 : Generalized Anxiety Score 03/21/2021 01/30/2020 06/12/2019  Nervous, Anxious, on Edge 0 0 0  Control/stop worrying 0 0 0  Worry too much - different things 0 0 0  Trouble relaxing 0 0 0  Restless 0 0 0  Easily annoyed or irritable 0 0 0  Afraid - awful might happen 0 0 0  Total GAD 7 Score 0 0 0    BP Readings from  Last 3 Encounters:  03/21/21 120/80  07/25/20 (!) 130/94  06/06/20 130/80    Physical Exam Vitals and nursing note reviewed.  HENT:     Head: Normocephalic.     Right Ear: Tympanic membrane, ear canal and external ear normal.     Left Ear: Tympanic membrane, ear canal and external ear normal.     Nose: No congestion or rhinorrhea.     Right Turbinates: Swollen.     Left Turbinates: Swollen.     Right Sinus: Maxillary sinus tenderness present. No frontal sinus tenderness.     Left Sinus: Maxillary sinus tenderness present. No frontal sinus tenderness.     Mouth/Throat:     Mouth: Mucous membranes are moist.  Eyes:     General: No scleral icterus.       Right eye: No discharge.        Left eye: No discharge.     Conjunctiva/sclera: Conjunctivae normal.     Pupils: Pupils are equal, round, and reactive to light.  Neck:     Thyroid: No thyromegaly.     Vascular: No JVD.     Trachea: No tracheal deviation.  Cardiovascular:     Rate and Rhythm: Normal rate and regular rhythm.     Heart sounds: Normal heart sounds. No murmur heard.   No friction rub. No gallop.  Pulmonary:     Effort: No respiratory distress.     Breath sounds: Normal breath sounds. No wheezing, rhonchi or rales.  Abdominal:     General: Bowel sounds are normal.     Palpations: Abdomen is soft. There is no mass.     Tenderness: There is no abdominal tenderness. There is no guarding or rebound.  Musculoskeletal:        General: No tenderness. Normal range of motion.     Cervical back: Normal range of motion and neck supple.  Lymphadenopathy:     Cervical: No cervical adenopathy.  Skin:    General: Skin is warm.     Findings: No rash.  Neurological:     Mental Status: He is alert and oriented to person, place, and time.     Cranial Nerves: No cranial nerve deficit.     Deep Tendon Reflexes: Reflexes are normal and symmetric.    Wt Readings from Last 3 Encounters:  03/21/21 197 lb (89.4 kg)  07/25/20 198  lb (89.8 kg)  06/06/20 201 lb (91.2 kg)    BP 120/80    Pulse 80    Ht 5' 8" (1.727 m)    Wt 197 lb (89.4 kg)    BMI 29.95 kg/m   Assessment and Plan:   1. Vertigo New onset.  Patient has had some history of this which he has been doing Epley maneuvers to control however he is traveling and he is going on a skiing trip and would like some meclizine in reserve.  We will refill on proceed with Epley maneuvers for control. - meclizine (  ANTIVERT) 25 MG tablet; Take 1 tablet (25 mg total) by mouth 3 (three) times daily as needed for dizziness.  Dispense: 30 tablet; Refill: 0  2. Benign paroxysmal positional vertigo of right ear Relatively new onset.  Followed by ENT.  Patient will continue Epley maneuvers as discussed.  We will refill his meclizine on an as-needed basis for when he travels by air or on ski slopes. - meclizine (ANTIVERT) 25 MG tablet; Take 1 tablet (25 mg total) by mouth 3 (three) times daily as needed for dizziness.  Dispense: 30 tablet; Refill: 0  3. Acute maxillary sinusitis, recurrence not specified New onset.  Persistent.  Stable.  Exam and history is consistent with sinus infection.  We will treat with azithromycin as directed.- azithromycin (ZITHROMAX) 250 MG tablet; Take 2 tablets on day 1, then 1 tablet daily on days 2 through 5  Dispense: 6 tablet; Refill: 0

## 2021-03-27 ENCOUNTER — Telehealth: Payer: Self-pay | Admitting: Family Medicine

## 2021-03-27 NOTE — Telephone Encounter (Signed)
Pt completed his Zpack however he still does not feel well, please advise patient would like either a refill or amoxacillin.      [facility], [place] - [address]  [address] [place] [address]  Phone: [phone] Fax: [phone]

## 2021-03-28 ENCOUNTER — Ambulatory Visit (INDEPENDENT_AMBULATORY_CARE_PROVIDER_SITE_OTHER): Payer: 59 | Admitting: Family Medicine

## 2021-03-28 ENCOUNTER — Other Ambulatory Visit: Payer: Self-pay

## 2021-03-28 ENCOUNTER — Encounter: Payer: Self-pay | Admitting: Family Medicine

## 2021-03-28 VITALS — BP 110/78 | HR 120 | Ht 68.0 in | Wt 196.0 lb

## 2021-03-28 DIAGNOSIS — J0100 Acute maxillary sinusitis, unspecified: Secondary | ICD-10-CM

## 2021-03-28 MED ORDER — PREDNISONE 10 MG PO TABS: 10.0000 mg | ORAL_TABLET | Freq: Every day | ORAL | 1 refills | Status: DC

## 2021-03-28 MED ORDER — AMOXICILLIN-POT CLAVULANATE 875-125 MG PO TABS: 1.0000 | ORAL_TABLET | Freq: Two times a day (BID) | ORAL | 0 refills | Status: DC

## 2021-03-28 NOTE — Progress Notes (Signed)
Date:  03/28/2021   Name:  Dustin Morse.   DOB:  10-Aug-1975   MRN:  [id number]   Chief Complaint: Sinusitis (Clear production, congested)  Sinusitis This is a recurrent problem. The current episode started 1 to 4 weeks ago. The problem has been waxing and waning since onset. There has been no fever. The pain is moderate. Associated symptoms include congestion and sinus pressure. Pertinent negatives include no chills, coughing, diaphoresis, ear pain, headaches, neck pain, shortness of breath or sore throat. Past treatments include oral decongestants and antibiotics (azithromycin).   No results found for: NA, K, CO2, GLUCOSE, BUN, CREATININE, CALCIUM, EGFR, GFRNONAA, GLUCOSE No results found for: CHOL, HDL, LDLCALC, LDLDIRECT, TRIG, CHOLHDL No results found for: TSH No results found for: HGBA1C Lab Results  Component Value Date   HGB 15.1 11/16/2017   No results found for: ALT, AST, GGT, ALKPHOS, BILITOT No results found for: 25OHVITD2, 25OHVITD3, VD25OH   Review of Systems  Constitutional:  Negative for chills, diaphoresis and fever.  HENT:  Positive for congestion and sinus pressure. Negative for drooling, ear discharge, ear pain and sore throat.   Respiratory:  Negative for cough, shortness of breath and wheezing.   Cardiovascular:  Negative for chest pain, palpitations and leg swelling.  Gastrointestinal:  Negative for abdominal pain, blood in stool, constipation, diarrhea and nausea.  Endocrine: Negative for polydipsia.  Genitourinary:  Negative for dysuria, frequency, hematuria and urgency.  Musculoskeletal:  Negative for back pain, myalgias and neck pain.  Skin:  Negative for rash.  Allergic/Immunologic: Negative for environmental allergies.  Neurological:  Negative for dizziness and headaches.  Hematological:  Does not bruise/bleed easily.  Psychiatric/Behavioral:  Negative for suicidal ideas. The patient is not nervous/anxious.    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  Current Meds  Medication Sig   acetaminophen (TYLENOL) 500 MG tablet Take 500 mg by mouth every 6 (six) hours as needed.   fexofenadine (ALLEGRA) 60 MG tablet Take 60 mg by mouth 2 (two) times daily. otc   fluticasone (FLONASE) 50 MCG/ACT nasal spray Place 2 sprays into both nostrils daily.   meclizine (ANTIVERT) 25 MG tablet Take 1 tablet (25 mg total) by mouth 3 (three) times daily as needed for dizziness.   Multiple Vitamin (ONE-A-DAY MENS PO) Take 1 tablet by mouth daily.   omeprazole (PRILOSEC) 20 MG capsule Take 20 mg by mouth daily. otc   saw palmetto 500 MG capsule Take 500 mg by mouth daily.   vitamin C (ASCORBIC ACID) 500 MG tablet Take 500 mg by mouth daily.    PHQ 2/9 Scores 03/21/2021 01/30/2020 06/12/2019 04/05/2017  PHQ - 2 Score 0 0 0 0  PHQ- 9 Score 0 0 0 -    GAD 7 : Generalized Anxiety Score 03/21/2021 01/30/2020 06/12/2019  Nervous, Anxious, on Edge 0 0 0  Control/stop worrying 0 0 0  Worry too much - different things 0 0 0  Trouble relaxing 0 0 0  Restless 0 0 0  Easily annoyed or irritable 0 0 0  Afraid - awful might happen 0 0 0  Total GAD 7 Score 0 0 0    BP Readings from Last 3 Encounters:  03/28/21 110/78  03/21/21 120/80  07/25/20 (!) 130/94    Physical Exam Vitals and nursing note reviewed.  HENT:     Head: Normocephalic.     Right Ear: Tympanic membrane, ear canal and external ear normal.     Left Ear: Tympanic membrane, ear canal and external ear normal.     Nose: Nose normal. No congestion or  rhinorrhea.  Eyes:     General: No scleral icterus.       Right eye: No discharge.        Left eye: No discharge.     Conjunctiva/sclera: Conjunctivae normal.     Pupils: Pupils are equal, round, and reactive to light.  Neck:     Thyroid: No thyromegaly.     Vascular: No JVD.     Trachea: No tracheal deviation.  Cardiovascular:     Rate and Rhythm: Normal rate and regular rhythm.     Heart sounds: Normal heart sounds. No murmur heard.   No friction rub. No gallop.  Pulmonary:     Effort: No respiratory distress.     Breath sounds: Normal breath sounds. No wheezing, rhonchi or rales.  Chest:     Chest wall: No tenderness.  Abdominal:     General: Bowel sounds are normal.     Palpations: Abdomen is soft. There is no mass.     Tenderness: There is no abdominal tenderness. There is no guarding or rebound.  Musculoskeletal:        General: No tenderness. Normal range of motion.     Cervical back: Normal range of motion and neck supple.  Lymphadenopathy:     Cervical: No cervical adenopathy.  Skin:    General: Skin is warm.     Findings: No rash.  Neurological:     Mental Status: He is alert and oriented to person, place, and time.     Cranial Nerves: No cranial nerve deficit.     Deep Tendon Reflexes: Reflexes are normal and symmetric.    Wt Readings from Last 3 Encounters:  03/28/21 196 lb (88.9 kg)  03/21/21 197 lb (89.4 kg)  07/25/20 198 lb (89.8 kg)    BP 110/78    Pulse (!) 120    Ht '5\' 8"'  (1.727 m)    Wt 196 lb (88.9 kg)    SpO2 98%    BMI 29.80 kg/m   Assessment and Plan:  1. Acute maxillary sinusitis, recurrence not specified Acute.  Persistent.  Patient is on day 7 of azithromycin but is getting ready to fly out to [place] and has not quite recovered resolved current sinusitis.  Patient has been given prescription for Augmentin as well as continuance of the prednisone and has been encouraged to switch from Allegra to Mucinex and use of saline nasal lavage. -  amoxicillin-clavulanate (AUGMENTIN) 875-125 MG tablet; Take 1 tablet by mouth 2 (two) times daily.  Dispense: 20 tablet; Refill: 0 - predniSONE (DELTASONE) 10 MG tablet; Take 1 tablet (10 mg total) by mouth daily with breakfast.  Dispense: 30 tablet; Refill: 1

## 2021-06-16 ENCOUNTER — Ambulatory Visit: Payer: Self-pay

## 2021-06-16 NOTE — Telephone Encounter (Signed)
Summary: Augmentin wanted for sinus infection asap  ? Pt asked to see if Delice Bison can send him an RX for Augmentin for his reoccurring sinus infection / pt is out of town and asked if this can be sent to [id number] - [place], [place] - [address]  ?Phone: [phone]  ?Fax: [phone]  ? ? ?pt leaves [place] at 4pm and will flying to [place] at 5pm today / please adv  ?  ? ?Chief Complaint: Sinus pain with green mucus. Right ear pain when flying. Currently out of town. ?Symptoms: Above ?Frequency: Started Friday. ?Pertinent Negatives: Patient denies fever ?Disposition: [] ED /[] Urgent Care (no appt availability in office) / [] Appointment(In office/virtual)/ []  [facility]/ [] Home Care/ [] Refused Recommended Disposition /[] [facility]/ []  Follow-up with PCP ?Additional Notes: Pt. Out of town and requesting medication be sent to above pharmacy. Please advise pt.  ?Answer Assessment - Initial Assessment Questions ?1. LOCATION: "Where does it hurt?"  ?    Face ?2. ONSET: "When did the sinus pain start?"  (e.g., hours, days)  ?    Friday ?3. SEVERITY: "How bad is the pain?"   (Scale 1-10; mild, moderate or severe) ?  - MILD (1-3): doesn't interfere with normal activities  ?  - MODERATE (4-7): interferes with normal activities (e.g., work or school) or awakens from sleep ?  - SEVERE (8-10): excruciating pain and patient unable to do any normal activities    ?    Moderate ?4. RECURRENT SYMPTOM: "Have you ever had sinus problems before?" If Yes, ask: "When was the last time?" and "What happened that time?"  ?    Yes ?5. NASAL CONGESTION: "Is the nose blocked?" If Yes, ask: "Can you open it or must you breathe through your mouth?" ?    No ?6. NASAL DISCHARGE: "Do you have discharge from your nose?" If so ask, "What color?" ?    Green ?7. FEVER: "Do you have a fever?" If Yes, ask: "What is it, how was it measured, and when did it start?"  ?    No ?8. OTHER SYMPTOMS: "Do you have any other  symptoms?" (e.g., sore throat, cough, earache, difficulty breathing) ?    Cough, right ear pain when flying ?9. PREGNANCY: "Is there any chance you are pregnant?" "When was your last menstrual period?" ?    N/a ? ?Protocols used: Sinus Pain or Congestion-A-AH ? ?

## 2021-06-23 ENCOUNTER — Encounter: Payer: Self-pay | Admitting: Family Medicine

## 2021-06-23 ENCOUNTER — Other Ambulatory Visit: Payer: Self-pay

## 2021-06-23 ENCOUNTER — Ambulatory Visit: Payer: 59 | Admitting: Family Medicine

## 2021-06-23 VITALS — BP 108/74 | HR 115 | Temp 98.0°F | Ht 68.0 in | Wt 192.0 lb

## 2021-06-23 DIAGNOSIS — J0100 Acute maxillary sinusitis, unspecified: Secondary | ICD-10-CM | POA: Diagnosis not present

## 2021-06-23 MED ORDER — BENZONATATE 100 MG PO CAPS: 100.0000 mg | ORAL_CAPSULE | Freq: Three times a day (TID) | ORAL | 0 refills | Status: DC | PRN

## 2021-06-23 MED ORDER — AMOXICILLIN-POT CLAVULANATE 875-125 MG PO TABS: 1.0000 | ORAL_TABLET | Freq: Two times a day (BID) | ORAL | 0 refills | Status: DC

## 2021-06-23 NOTE — Progress Notes (Signed)
? ? ?Date:  06/23/2021  ? ?Name:  Dustin Morse.   DOB:  1976-03-16   MRN:  [id number] ? ? ?Chief Complaint: Sinusitis (X 1 week, cough (yellow/green mucous), runny noses, tried sudafed helped some and Flonase, no fever, neg covid test ) ? ?Sinusitis ?This is a new problem. The current episode started in the past 7 days. The problem has been gradually worsening since onset. There has been no fever. The pain is mild. Associated symptoms include congestion, coughing and sinus pressure. Pertinent negatives include no chills, diaphoresis, ear pain, headaches, hoarse voice, neck pain, shortness of breath, sneezing, sore throat or swollen glands. (Post nasal yellow/green) Past treatments include oral decongestants (flonase). The treatment provided no relief.  ? ?No results found for: NA, K, CO2, GLUCOSE, BUN, CREATININE, CALCIUM, EGFR, GFRNONAA, GLUCOSE ?No results found for: CHOL, HDL, LDLCALC, LDLDIRECT, TRIG, CHOLHDL ?No results found for: TSH ?No results found for: HGBA1C ?Lab Results  ?Component Value Date  ? HGB 15.1 11/16/2017  ? ?No results found for: ALT, AST, GGT, ALKPHOS, BILITOT ?No results found for: 25OHVITD2, [place], VD25OH  ? ?Review of Systems  ?Constitutional:  Negative for chills, diaphoresis and fever.  ?HENT:  Positive for congestion and sinus pressure. Negative for drooling, ear discharge, ear pain, hoarse voice, sneezing and sore throat.   ?Respiratory:  Positive for cough. Negative for shortness of breath and wheezing.   ?Cardiovascular:  Negative for chest pain, palpitations and leg swelling.  ?Gastrointestinal:  Negative for abdominal pain, blood in stool, constipation, diarrhea and nausea.  ?Endocrine: Negative for polydipsia.  ?Genitourinary:  Negative for dysuria, frequency, hematuria and urgency.  ?Musculoskeletal:  Negative for back pain, myalgias and neck pain.  ?Skin:  Negative for rash.  ?Allergic/Immunologic: Negative for environmental allergies.  ?Neurological:  Negative for  dizziness and headaches.  ?Hematological:  Does not bruise/bleed easily.  ?Psychiatric/Behavioral:  Negative for suicidal ideas. The patient is not nervous/anxious.   ? ?Patient Active Problem List  ? Diagnosis Date Noted  ? Hx of labyrinthitis 07/15/2017  ? 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 ? ?No Known Allergies ? ?Past Surgical History:  ?Procedure Laterality Date  ? COLONOSCOPY  2008  ? cleared for 10 yrs- Dr Allen Norris  ? ? ?Social History  ? ?Tobacco Use  ? Smoking status: Never  ? Smokeless tobacco: Never  ?Substance Use Topics  ? Alcohol use: Yes  ?  Alcohol/week: 0.0 standard drinks  ? Drug use: No  ? ? ? ?Medication list has been reviewed and updated. ? ?Current Meds  ?Medication Sig  ? acetaminophen (TYLENOL) 500 MG tablet Take 500 mg by mouth every 6 (six) hours as needed.  ? fexofenadine (ALLEGRA) 60 MG tablet Take 60 mg by mouth as needed. otc  ? fluticasone (FLONASE) 50 MCG/ACT nasal spray Place 2 sprays into both nostrils daily.  ? meclizine (ANTIVERT) 25 MG tablet Take 1 tablet (25 mg total) by mouth 3 (three) times daily as needed for dizziness.  ? Multiple Vitamin (ONE-A-DAY MENS PO) Take 1 tablet by mouth daily.  ? omeprazole (PRILOSEC) 20 MG capsule Take 20 mg by mouth daily. otc  ? predniSONE (DELTASONE) 10 MG tablet Take 1 tablet (10 mg total) by mouth daily with breakfast. (Patient taking differently: Take 10 mg by mouth as needed.)  ? predniSONE (DELTASONE) 10 MG tablet Take 1 tablet (10 mg total) by mouth daily with breakfast.  ? saw palmetto 500 MG capsule Take 500 mg by mouth daily.  ? vitamin  C (ASCORBIC ACID) 500 MG tablet Take 500 mg by mouth daily.  ? ? ?PHQ 2/9 Scores 06/23/2021 03/21/2021 01/30/2020 06/12/2019  ?PHQ - 2 Score 0 0 0 0  ?PHQ- 9 Score 0 0 0 0  ? ? ?GAD 7 : Generalized Anxiety Score 06/23/2021 03/21/2021 01/30/2020 06/12/2019  ?Nervous, Anxious, on Edge 0 0 0 0  ?Control/stop worrying 0 0 0 0  ?Worry too much - different things 0 0 0 0  ?Trouble relaxing 0 0 0 0  ?Restless 0 0 0 0   ?Easily annoyed or irritable 0 0 0 0  ?Afraid - awful might happen 0 0 0 0  ?Total GAD 7 Score 0 0 0 0  ?Anxiety Difficulty Not difficult at all - - -  ? ? ?BP Readings from Last 3 Encounters:  ?06/23/21 108/74  ?03/28/21 110/78  ?03/21/21 120/80  ? ? ?Physical Exam ?Vitals and nursing note reviewed.  ?Constitutional:   ?   Appearance: Normal appearance.  ?HENT:  ?   Head: Normocephalic.  ?   Right Ear: Tympanic membrane and external ear normal.  ?   Left Ear: Tympanic membrane and external ear normal.  ?   Nose: Nose normal. No congestion or rhinorrhea.  ?Eyes:  ?   General: No scleral icterus.    ?   Right eye: No discharge.     ?   Left eye: No discharge.  ?   Conjunctiva/sclera: Conjunctivae normal.  ?   Pupils: Pupils are equal, round, and reactive to light.  ?Neck:  ?   Thyroid: No thyromegaly.  ?   Vascular: No JVD.  ?   Trachea: No tracheal deviation.  ?Cardiovascular:  ?   Rate and Rhythm: Normal rate and regular rhythm.  ?   Heart sounds: Normal heart sounds. No murmur heard. ?  No friction rub. No gallop.  ?Pulmonary:  ?   Effort: No respiratory distress.  ?   Breath sounds: Normal breath sounds. No wheezing, rhonchi or rales.  ?Abdominal:  ?   General: Bowel sounds are normal.  ?   Palpations: Abdomen is soft. There is no mass.  ?   Tenderness: There is no abdominal tenderness. There is no guarding or rebound.  ?Musculoskeletal:     ?   General: No tenderness. Normal range of motion.  ?   Cervical back: Normal range of motion and neck supple.  ?Lymphadenopathy:  ?   Cervical: No cervical adenopathy.  ?Skin: ?   General: Skin is warm.  ?   Findings: No rash.  ?Neurological:  ?   Mental Status: He is alert.  ?   Deep Tendon Reflexes: Reflexes are normal and symmetric.  ? ? ?Wt Readings from Last 3 Encounters:  ?06/23/21 192 lb (87.1 kg)  ?03/28/21 196 lb (88.9 kg)  ?03/21/21 197 lb (89.4 kg)  ? ? ?BP 108/74   Pulse (!) 115   Temp 98 ?F (36.7 ?C) (Oral)   Ht _0  (1.727 m)   Wt 192 lb (87.1 kg)    SpO2 97%   BMI 29.19 kg/m?  ? ?Assessment and Plan: ? ?1. Acute maxillary sinusitis, recurrence not specified ?Acute.  Persistent.  Stable.  Exam and history is consistent with acute maxillary sinusitis and will treat with Augmentin 875 mg twice a day and Tessalon Perles 100 mg every 8-12 hours as needed cough. ?- amoxicillin-clavulanate (AUGMENTIN) 875-125 MG tablet; Take 1 tablet by mouth 2 (two) times daily.  Dispense: 20 tablet; Refill: 0 ?-  benzonatate (TESSALON PERLES) 100 MG capsule; Take 1 capsule (100 mg total) by mouth 3 (three) times daily as needed for cough.  Dispense: 20 capsule; Refill: 0  ? ? ?

## 2022-01-11 ENCOUNTER — Encounter: Payer: Self-pay | Admitting: Emergency Medicine

## 2022-01-11 ENCOUNTER — Ambulatory Visit
Admission: EM | Admit: 2022-01-11 | Discharge: 2022-01-11 | Disposition: A | Discharge status: Home / Self Care | Payer: BLUE CROSS/BLUE SHIELD | Attending: Family Medicine | Admitting: Family Medicine

## 2022-01-11 DIAGNOSIS — W57XXXA Bitten or stung by nonvenomous insect and other nonvenomous arthropods, initial encounter: Secondary | ICD-10-CM | POA: Diagnosis not present

## 2022-01-11 DIAGNOSIS — T7840XA Allergy, unspecified, initial encounter: Secondary | ICD-10-CM

## 2022-01-11 DIAGNOSIS — R591 Generalized enlarged lymph nodes: Secondary | ICD-10-CM

## 2022-01-11 DIAGNOSIS — S90562A Insect bite (nonvenomous), left ankle, initial encounter: Secondary | ICD-10-CM

## 2022-01-11 MED ORDER — DEXAMETHASONE SODIUM PHOSPHATE 10 MG/ML IJ SOLN
10.0000 mg | Freq: Once | INTRAMUSCULAR | Status: AC
Start: 2022-01-11 — End: 2022-01-11
  Administered 2022-01-11: 10 mg via INTRAMUSCULAR

## 2022-01-11 NOTE — ED Provider Notes (Signed)
[facility]    CSN: [id number] Arrival date & time: 01/11/22  1527      History   Chief Complaint Chief Complaint  Patient presents with   Insect Bite    HPI [name]. is a 46 y.o. male.   HPI  Patient reports around 11 AM this morning he was stung by yellow jacket on his left ankle.  About 2 to 3 hours later he noticed the swelling and numbness in his arm.  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  Denies rash, shortness of breath, nausea, vomiting, abdominal pain, chest pain, palpitations.  He took some Tylenol prior to arrival.  States his neighbor brought over some Benadryl but he has not taken it yet.   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 Authorizing Provider  aspirin 81 MG EC tablet Take by mouth. 04/21/19  Yes [provider]  Multiple Vitamin (ONE-A-DAY MENS PO) Take 1 tablet by mouth daily.   Yes [provider]  acetaminophen (TYLENOL) 500 MG tablet Take 500 mg by mouth every 6 (six) hours as needed.    [provider]  amoxicillin-clavulanate (AUGMENTIN) 875-125 MG tablet Take 1 tablet by mouth 2 (two) times daily. 06/23/21   Juline Patch, MD  benzonatate (TESSALON PERLES) 100 MG capsule Take 1 capsule (100 mg total) by mouth 3 (three) times daily as needed for cough. 06/23/21   Juline Patch, MD  fexofenadine (ALLEGRA) 60 MG tablet Take 60 mg by mouth as needed. otc    [provider]  fluticasone (FLONASE) 50 MCG/ACT nasal spray Place 2 sprays into both nostrils daily. 06/06/20   Juline Patch, MD  meclizine  (ANTIVERT) 25 MG tablet Take 1 tablet (25 mg total) by mouth 3 (three) times daily as needed for dizziness. 03/21/21   Juline Patch, MD  omeprazole (PRILOSEC) 20 MG capsule Take 20 mg by mouth daily. otc    [provider]  predniSONE (DELTASONE) 10 MG tablet Take 1 tablet (10 mg total) by mouth daily with breakfast. Patient taking differently: Take 10 mg by mouth as needed. 02/14/21   Juline Patch, MD  predniSONE (DELTASONE) 10 MG tablet Take 1 tablet (10 mg total) by mouth daily with breakfast. 03/28/21   Juline Patch, MD  saw palmetto 500 MG capsule Take 500 mg by mouth daily.    [provider]  vitamin C (ASCORBIC ACID) 500 MG tablet Take 500 mg by mouth daily.    [provider]    Family History History reviewed. No pertinent family history.  Social History Social History   Tobacco Use   Smoking status: Never   Smokeless tobacco: Never  Vaping Use   Vaping Use: Never used  Substance Use Topics   Alcohol use: Yes    Alcohol/week: 0.0 standard drinks of alcohol   Drug use: No     Allergies   Patient has no known allergies.   Review of Systems Review of Systems:negative unless  otherwise stated in HPI.      Physical Exam Triage Vital Signs ED Triage Vitals  Enc Vitals Group     BP 01/11/22 1555 114/79     Pulse Rate 01/11/22 1555 87     Resp 01/11/22 1555 15     Temp 01/11/22 1555 98.3 F (36.8 C)     Temp Source 01/11/22 1555 Oral     SpO2 01/11/22 1555 99 %     Weight 01/11/22 1552 185 lb (83.9 kg)     Height 01/11/22 1552 5\' 8"  (1.727 m)     Head Circumference --      Peak Flow --      Pain Score 01/11/22 1552 3     Pain Loc --      Pain Edu? --      Excl. in GC? --    No data found.  Updated Vital Signs BP 114/79 (BP Location: Left Arm)   Pulse 87   Temp 98.3 F (36.8 C) (Oral)   Resp 15   Ht 5\' 8"  (1.727 m)   Wt 83.9 kg   SpO2 99%   BMI 28.13 kg/m   Visual Acuity Right Eye Distance:   Left Eye  Distance:   Bilateral Distance:    Right Eye Near:   Left Eye Near:    Bilateral Near:     Physical Exam Vitals and nursing note reviewed.  Constitutional:      Appearance: Normal appearance. He is not ill-appearing.  HENT:     Head: Normocephalic and atraumatic.     Nose: Nose normal. No rhinorrhea.     Mouth/Throat:     Mouth: Mucous membranes are moist.     Pharynx: Oropharynx is clear.  Eyes:     Extraocular Movements: Extraocular movements intact.     Conjunctiva/sclera: Conjunctivae normal.     Pupils: Pupils are equal, round, and reactive to light.  Cardiovascular:     Rate and Rhythm: Normal rate and regular rhythm.     Pulses: Normal pulses.     Heart sounds: Normal heart sounds.  Pulmonary:     Effort: Pulmonary effort is normal. No respiratory distress.     Breath sounds: Normal breath sounds.  Abdominal:     Palpations: Abdomen is soft.     Tenderness: There is no abdominal tenderness.  Musculoskeletal:        General: No swelling. Normal range of motion.     Cervical back: Normal range of motion.  Skin:    General: Skin is warm and dry.     Capillary Refill: Capillary refill takes less than 2 seconds.     Findings: No rash.  Neurological:     Mental Status: He is alert and oriented to person, place, and time.     Coordination: Coordination normal.     Gait: Gait normal.  Psychiatric:        Mood and Affect: Mood normal.        Behavior: Behavior normal.      UC Treatments / Results  Labs (all labs ordered are listed, but only abnormal results are displayed) Labs Reviewed - No data to display  EKG   Radiology No results found.  Procedures Procedures (including critical care time)  Medications Ordered in UC Medications  dexamethasone (DECADRON) injection 10 mg (10 mg Intramuscular Given 01/11/22 1621)    Initial Impression / Assessment and Plan / UC Course  I have reviewed the triage vital signs and the nursing  notes.  Pertinent labs &  imaging results that were available during my care of the patient were reviewed by me and considered in my medical decision making (see chart for details).     Patient is a 46 year old male who was bitten by a yellow jacket on his left ankle around 10 AM.  Two hours later he noticed the swelling and numbness in his right arm that radiated across his chest.  He was given IM Decadron 10 mg to forego  possible allergic reaction.    I do not think this is an anaphylactic reaction at this time.  Advised to use Benadryl and Pepcid to forego symptoms at home.  ED precautions strictly discussed.  Patient voiced understanding.  Discussed MDM, treatment plan and plan for follow-up with patient/parent who agrees with plan.     Final Clinical Impressions(s) / UC Diagnoses   Final diagnoses:  Insect bite of left ankle, initial encounter  Allergic reaction, initial encounter     Discharge Instructions      You are given an injection of steroids today after insect bite.  Take Benadryl every 4-6 hours and Pepcid twice a day as needed.   Go to ED for red flag symptoms, including; severe headaches, vision changes, numbness/weakness in part of the body, lethargy, confusion, intractable vomiting, severe dehydration, chest pain, breathing difficulty, severe persistent abdominal or pelvic pain, signs of severe infection (increased redness, swelling of an area), feeling faint or passing out, dizziness, etc. You should especially go to the ED for sudden acute worsening of condition if you do not elect to go at this time.       ED Prescriptions   None    PDMP not reviewed this encounter.   Katha Cabal, DO 01/11/22 1632

## 2022-01-11 NOTE — Discharge Instructions (Signed)
You are given an injection of steroids today after insect bite.  Take Benadryl every 4-6 hours and Pepcid twice a day as needed.   Go to ED for red flag symptoms, including; severe headaches, vision changes, numbness/weakness in part of the body, lethargy, confusion, intractable vomiting, severe dehydration, chest pain, breathing difficulty, severe persistent abdominal or pelvic pain, signs of severe infection (increased redness, swelling of an area), feeling faint or passing out, dizziness, etc. You should especially go to the ED for sudden acute worsening of condition if you do not elect to go at this time.

## 2022-01-11 NOTE — ED Triage Notes (Signed)
Patient states that he got stung by a yellow jacket on his left ankle this morning.  Patient states that about a hour ago he noticed swelling under his right arm and on the right side of his chest.  Patient denies SOB, denies difficulty breathing or swallowing.

## 2022-02-13 ENCOUNTER — Ambulatory Visit (INDEPENDENT_AMBULATORY_CARE_PROVIDER_SITE_OTHER): Payer: BLUE CROSS/BLUE SHIELD | Admitting: Family Medicine

## 2022-02-13 ENCOUNTER — Telehealth: Payer: Self-pay

## 2022-02-13 ENCOUNTER — Other Ambulatory Visit: Payer: Self-pay

## 2022-02-13 ENCOUNTER — Encounter: Payer: Self-pay | Admitting: Family Medicine

## 2022-02-13 VITALS — BP 112/90 | HR 106 | Ht 68.0 in | Wt 198.0 lb

## 2022-02-13 DIAGNOSIS — J0100 Acute maxillary sinusitis, unspecified: Secondary | ICD-10-CM

## 2022-02-13 DIAGNOSIS — B001 Herpesviral vesicular dermatitis: Secondary | ICD-10-CM

## 2022-02-13 MED ORDER — AMOXICILLIN 500 MG PO CAPS: 500.0000 mg | ORAL_CAPSULE | Freq: Three times a day (TID) | ORAL | 0 refills | Status: AC

## 2022-02-13 MED ORDER — PREDNISONE 10 MG PO TABS: 10.0000 mg | ORAL_TABLET | Freq: Every day | ORAL | 1 refills | Status: DC | PRN

## 2022-02-13 MED ORDER — PREDNISONE 10 MG PO TABS: 10.0000 mg | ORAL_TABLET | ORAL | 1 refills | Status: DC | PRN

## 2022-02-13 NOTE — Telephone Encounter (Signed)
Kiko, RPH with [facility] calling for clarification on Prednisone 10mg  as needed for cold sores, states that medication isn't typically used as needed or for cold sores so wanted clarification before filling. Advised of notes from OV today but prefers to reach out to provider to make sure correct. Advised will route and nurse will FU.

## 2022-02-13 NOTE — Patient Instructions (Signed)

## 2022-02-13 NOTE — Progress Notes (Signed)
Date:  02/13/2022   Name:  Dustin Morse.   DOB:  1976/02/10   MRN:  [id number]   Chief Complaint: Sinusitis (Started 2 nights ago. Sore throat. Slight drainage in throat. No fever or shortness of breath. No cough.)  Sinusitis This is a new problem. The current episode started in the past 7 days. The problem has been gradually improving since onset. There has been no fever. The pain is mild. Associated symptoms include congestion, sinus pressure and a sore throat. Pertinent negatives include no chills, coughing, diaphoresis, ear pain, headaches, hoarse voice, neck pain, shortness of breath, sneezing or swollen glands.    No results found for: "NA", "K", "CO2", "GLUCOSE", "BUN", "CREATININE", "CALCIUM", "EGFR", "GFRNONAA" No results found for: "CHOL", "HDL", "LDLCALC", "LDLDIRECT", "TRIG", "CHOLHDL" No results found for: "TSH" No results found for: "HGBA1C" Lab Results  Component Value Date   HGB 15.1 11/16/2017   No results found for: "ALT", "AST", "GGT", "ALKPHOS", "BILITOT" No results found for: "25OHVITD2", "25OHVITD3", "VD25OH"   Review of Systems  Constitutional:  Negative for chills, diaphoresis and fever.  HENT:  Positive for congestion, sinus pressure and sore throat. Negative for ear pain, hoarse voice, nosebleeds, postnasal drip, rhinorrhea and sneezing.   Respiratory:  Negative for cough, shortness of breath and wheezing.   Musculoskeletal:  Negative for neck pain.  Neurological:  Negative for headaches.    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  Current Meds  Medication Sig   fluticasone (FLONASE) 50 MCG/ACT nasal spray Place 2 sprays into both nostrils daily.   meclizine (ANTIVERT) 25 MG tablet Take 1 tablet (25 mg total) by mouth 3 (three) times daily as needed for dizziness.   Multiple Vitamin (ONE-A-DAY MENS PO) Take 1 tablet by mouth daily.   omeprazole (PRILOSEC) 20 MG capsule Take 20 mg by mouth daily. otc   pseudoephedrine (SUDAFED) 120 MG 12 hr tablet Take 120 mg by mouth 2 (two) times daily.   saw palmetto 500 MG capsule Take 500 mg by mouth daily.   [DISCONTINUED] fexofenadine (ALLEGRA) 60 MG tablet Take 60 mg by mouth as needed. otc   [DISCONTINUED] vitamin C (ASCORBIC ACID) 500 MG tablet Take 500 mg by mouth daily.       06/23/2021   11:33 AM 03/21/2021    8:20 AM 01/30/2020    3:27 PM 06/12/2019    9:57 AM  GAD 7 : Generalized Anxiety Score  Nervous, Anxious, on Edge 0 0 0 0  Control/stop worrying 0 0 0 0  Worry too much - different things 0 0 0 0  Trouble relaxing 0 0 0 0  Restless 0 0 0 0  Easily annoyed or irritable 0 0 0 0  Afraid - awful might happen 0 0 0 0  Total GAD 7 Score 0 0 0 0  Anxiety Difficulty Not difficult at all  06/23/2021   11:32 AM 03/21/2021    8:19 AM 01/30/2020    3:27 PM  Depression screen PHQ 2/9  Decreased Interest 0 0 0  Down, Depressed, Hopeless 0 0 0  PHQ - 2 Score 0 0 0  Altered sleeping 0 0 0  Tired, decreased energy 0 0 0  Change in appetite 0 0 0  Feeling bad or failure about yourself  0 0 0  Trouble concentrating 0 0 0  Moving slowly or fidgety/restless 0 0 0  Suicidal thoughts 0 0 0  PHQ-9 Score 0 0 0    BP Readings from Last 3 Encounters:  02/13/22 (!) 112/90  01/11/22 114/79  06/23/21 108/74    Physical Exam Vitals and nursing note reviewed.  HENT:     Head: Normocephalic.     Right Ear: Tympanic membrane and external ear normal.     Left Ear: Tympanic membrane  and external ear normal.     Nose: Nose normal.     Mouth/Throat:     Lips: Pink.     Mouth: Mucous membranes are moist.     Tongue: No lesions.     Palate: No mass.     Pharynx: Oropharynx is clear.  Eyes:     General: No scleral icterus.       Right eye: No discharge.        Left eye: No discharge.     Conjunctiva/sclera: Conjunctivae normal.     Pupils: Pupils are equal, round, and reactive to light.  Neck:     Thyroid: No thyromegaly.     Vascular: No JVD.     Trachea: No tracheal deviation.  Cardiovascular:     Rate and Rhythm: Normal rate and regular rhythm.     Heart sounds: Normal heart sounds. No murmur heard.    No friction rub. No gallop.  Pulmonary:     Effort: No respiratory distress.     Breath sounds: Normal breath sounds. No wheezing or rales.  Abdominal:     General: Bowel sounds are normal.     Palpations: Abdomen is soft. There is no mass.     Tenderness: There is no abdominal tenderness. There is no guarding or rebound.  Musculoskeletal:        General: No tenderness. Normal range of motion.     Cervical back: Normal range of motion and neck supple.  Lymphadenopathy:     Cervical: No cervical adenopathy.  Skin:    General: Skin is warm.     Findings: No rash.  Neurological:     Mental Status: He is alert and oriented to person, place, and time.     Cranial Nerves: No cranial nerve deficit.     Deep Tendon Reflexes: Reflexes are normal and symmetric.     Wt Readings from Last 3 Encounters:  02/13/22 198 lb (89.8 kg)  01/11/22 185 lb (83.9 kg)  06/23/21 192 lb (87.1 kg)    BP (!) 112/90 (BP Location: Left Arm, Patient Position: Sitting, Cuff Size: Normal)   Pulse (!) 106   Ht _0  (1.727 m)   Wt 198 lb (89.8 kg)   SpO2 95%   BMI 30.11 kg/m   Assessment and Plan:   1. Acute maxillary sinusitis, recurrence not specified Chronic.  Controlled.  Stable.  Patient has onset of symptoms within the weekend and has continued to have symptomatology  and physical evaluation indicating a maxillary sinusitis.  We will treat with amoxicillin 500 mg 3  times a day for 10 days. - amoxicillin (AMOXIL) 500 MG capsule; Take 1 capsule (500 mg total) by mouth 3 (three) times daily for 10 days.  Dispense: 30 capsule; Refill: 0  2. Recurrent cold sores Patient with recurrent cold sores which is treated with prednisone as needed once a day. - predniSONE (DELTASONE) 10 MG tablet; Take 1 tablet (10 mg total) by mouth as needed.  Dispense: 30 tablet; Refill: 1    Otilio Miu, MD

## 2022-08-28 ENCOUNTER — Encounter: Payer: Self-pay | Admitting: Family Medicine

## 2022-08-28 ENCOUNTER — Ambulatory Visit (INDEPENDENT_AMBULATORY_CARE_PROVIDER_SITE_OTHER): Payer: Commercial Managed Care - PPO | Admitting: Family Medicine

## 2022-08-28 VITALS — BP 118/78 | HR 80 | Ht 68.0 in | Wt 191.0 lb

## 2022-08-28 DIAGNOSIS — K219 Gastro-esophageal reflux disease without esophagitis: Secondary | ICD-10-CM | POA: Diagnosis not present

## 2022-08-28 DIAGNOSIS — J0100 Acute maxillary sinusitis, unspecified: Secondary | ICD-10-CM

## 2022-08-28 DIAGNOSIS — S39012S Strain of muscle, fascia and tendon of lower back, sequela: Secondary | ICD-10-CM | POA: Diagnosis not present

## 2022-08-28 DIAGNOSIS — B001 Herpesviral vesicular dermatitis: Secondary | ICD-10-CM | POA: Diagnosis not present

## 2022-08-28 MED ORDER — PANTOPRAZOLE SODIUM 40 MG PO TBEC: 40.0000 mg | DELAYED_RELEASE_TABLET | Freq: Every day | ORAL | 3 refills | Status: DC

## 2022-08-28 MED ORDER — MELOXICAM 15 MG PO TABS: 15.0000 mg | ORAL_TABLET | Freq: Every day | ORAL | 0 refills | Status: DC

## 2022-08-28 MED ORDER — AMOXICILLIN-POT CLAVULANATE 875-125 MG PO TABS: 1.0000 | ORAL_TABLET | Freq: Two times a day (BID) | ORAL | 0 refills | Status: DC

## 2022-08-28 NOTE — Progress Notes (Signed)
Date:  08/28/2022   Name:  Dustin Morse.   DOB:  Mar 05, 1976   MRN:  [id number]   Chief Complaint: Sinusitis (Thick production yellow, headache, congestion- tried sudafed and flonase. Needs something else) and Hip Pain (Jumped down and foot slipped- hip is inflammed- wants meloxicam)  Sinusitis This is a chronic problem. The current episode started in the past 7 days. The problem has been gradually worsening since onset. There has been no fever. The pain is mild. Associated symptoms include congestion, coughing, sinus pressure and sneezing. Pertinent negatives include no ear pain, headaches, shortness of breath or sore throat. Past treatments include oral decongestants. The treatment provided mild relief.  Back Pain This is a new problem. The current episode started more than 1 month ago (6weeks). The pain is present in the lumbar spine. The quality of the pain is described as aching. Radiates to: right buttock. The pain is at a severity of 3/10. The symptoms are aggravated by bending, twisting and sitting. Pertinent negatives include no chest pain, headaches, numbness, tingling or weakness.    No results found for: "NA", "K", "CO2", "GLUCOSE", "BUN", "CREATININE", "CALCIUM", "EGFR", "GFRNONAA" No results found for: "CHOL", "HDL", "LDLCALC", "LDLDIRECT", "TRIG", "CHOLHDL" No results found for: "TSH" No results found for: "HGBA1C" Lab Results  Component Value Date   HGB 15.1 11/16/2017   No results found for: "ALT", "AST", "GGT", "ALKPHOS", "BILITOT" No results found for: "25OHVITD2", "25OHVITD3", "VD25OH"   Review of Systems  HENT:  Positive for congestion, sinus pressure and sneezing. Negative for ear pain and sore throat.   Respiratory:  Positive for cough. Negative for shortness of breath and wheezing.   Cardiovascular:  Negative for chest pain.  Musculoskeletal:  Positive for back pain.  Neurological:  Negative for tingling, weakness, numbness and headaches.    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  Current Meds  Medication Sig   acetaminophen (TYLENOL) 500 MG tablet Take 500 mg by mouth every 6 (six) hours as needed.   aspirin 81 MG EC tablet Take by mouth.   fluticasone (FLONASE) 50 MCG/ACT nasal spray Place 2 sprays into both nostrils daily.   Multiple Vitamin (ONE-A-DAY MENS PO) Take 1 tablet by mouth daily.   predniSONE (DELTASONE) 10 MG tablet Take 1 tablet (10 mg total) by mouth daily as needed (cold sores).   pseudoephedrine (SUDAFED) 120 MG 12 hr tablet Take 120 mg by mouth 2 (two) times daily.   saw palmetto 500 MG capsule Take 500 mg by mouth daily.   [DISCONTINUED] omeprazole (PRILOSEC) 20 MG capsule Take 20 mg by mouth daily. otc       08/28/2022   11:00 AM 06/23/2021   11:33 AM 03/21/2021    8:20 AM 01/30/2020    3:27 PM  GAD 7 : Generalized Anxiety Score  Nervous, Anxious, on Edge 0 0 0 0  Control/stop worrying  0 0 0 0  Worry too much - different things 0 0 0 0  Trouble relaxing 0 0 0 0  Restless 0 0 0 0  Easily annoyed or irritable 0 0 0 0  Afraid - awful might happen 0 0 0 0  Total GAD 7 Score 0 0 0 0  Anxiety Difficulty Not difficult at all Not difficult at all         08/28/2022   10:59 AM 06/23/2021   11:32 AM 03/21/2021    8:19 AM  Depression screen PHQ 2/9  Decreased Interest 0 0 0  Down, Depressed, Hopeless 0 0 0  PHQ - 2 Score 0 0 0  Altered sleeping 0 0 0  Tired, decreased energy 0 0 0  Change in appetite 0 0 0  Feeling  bad or failure about yourself  0 0 0  Trouble concentrating 0 0 0  Moving slowly or fidgety/restless 0 0 0  Suicidal thoughts 0 0 0  PHQ-9 Score 0 0 0  Difficult doing work/chores Not difficult at all      BP Readings from Last 3 Encounters:  08/28/22 118/78  02/13/22 (!) 112/90  01/11/22 114/79    Physical Exam Vitals and nursing note reviewed.  HENT:     Head: Normocephalic.     Right Ear: Tympanic membrane, ear canal and external ear normal.     Left Ear: Tympanic membrane, ear canal and external ear normal.     Nose: Nose normal.     Mouth/Throat:     Mouth: Mucous membranes are moist.  Eyes:     General: No scleral icterus.       Right eye: No discharge.        Left eye: No discharge.     Conjunctiva/sclera: Conjunctivae normal.     Pupils: Pupils are equal, round, and reactive to light.  Neck:     Thyroid: No thyromegaly.     Vascular: No JVD.     Trachea: No tracheal deviation.  Cardiovascular:     Rate and Rhythm: Normal rate and regular rhythm.     Heart sounds: Normal heart sounds. No murmur heard.    No friction rub. No gallop.  Pulmonary:     Effort: No respiratory distress.     Breath sounds: Normal breath sounds. No wheezing, rhonchi or rales.  Abdominal:     General: Bowel sounds are normal.     Palpations: Abdomen is soft. There is no mass.     Tenderness: There is no abdominal tenderness. There is no guarding or rebound.  Musculoskeletal:        General: No tenderness. Normal range of motion.     Cervical back: Normal range of motion and neck supple.  Lymphadenopathy:     Cervical: No cervical adenopathy.  Skin:    General: Skin is warm.     Findings: No rash.  Neurological:     Mental Status: He is alert and oriented to person, place, and time.     Cranial Nerves: No cranial nerve deficit.     Deep Tendon Reflexes: Reflexes are normal and symmetric.     Wt Readings from Last 3 Encounters:  08/28/22 191 lb (86.6 kg)  02/13/22 198 lb (89.8  kg)  01/11/22 185 lb (83.9 kg)    BP 118/78   Pulse 80   Ht 5\' 8"  (1.727 m)   Wt 191 lb (86.6 kg)   SpO2 99%   BMI 29.04 kg/m  Assessment and Plan:  1. Acute maxillary sinusitis, recurrence not specified New onset.  Persistent.  Relatively stable but has progressed to the point of productivity sinus discharge and discomfort.  Tenderness is noted over the maxillary sinuses bilateral we will treat with Augmentin 875 mg twice a day. - amoxicillin-clavulanate (AUGMENTIN) 875-125 MG tablet; Take 1 tablet by mouth 2 (two) times daily.  Dispense: 20 tablet; Refill: 0  2. Recurrent cold sores Patient with recurrent cold sores which he treats with prednisone on a as needed basis  3. Lumbar strain, sequela New onset.  Persistent.  Patient jumped off of a trailer and tweaked his lower back.  Patient is seeing a chiropractor but would like anti-inflammatories for additional therapy and we will prescribe meloxicam 15 mg once a day. - meloxicam (MOBIC) 15 MG tablet; Take 1 tablet (15 mg total) by mouth daily.  Dispense: 30 tablet; Refill: 0  4. Gastroesophageal reflux disease, unspecified whether esophagitis present Chronic.  Controlled.  Stable.  Patient has not had reasonable relief with other over-the-counter PPIs and we will prescribe pantoprazole that he is tolerated and been efficacious in the past. - pantoprazole (PROTONIX) 40 MG tablet; Take 1 tablet (40 mg total) by mouth daily.  Dispense: 30 tablet; Refill: 3    Elizabeth Sauer, MD

## 2022-09-29 ENCOUNTER — Other Ambulatory Visit: Payer: Self-pay

## 2022-09-29 ENCOUNTER — Telehealth: Payer: Self-pay

## 2022-09-29 ENCOUNTER — Telehealth: Payer: Self-pay | Admitting: Family Medicine

## 2022-09-29 DIAGNOSIS — B001 Herpesviral vesicular dermatitis: Secondary | ICD-10-CM

## 2022-09-29 MED ORDER — PREDNISONE 10 MG PO TABS: 10.0000 mg | ORAL_TABLET | Freq: Every day | ORAL | 1 refills | Status: AC | PRN

## 2022-09-29 NOTE — Telephone Encounter (Signed)
Copied from CRM [id number]. Topic: General - Inquiry >> Sep 29, 2022 12:45 PM De Blanch wrote: Reason for CRM: Pt is requesting a callback from [place]. Pt declined to provide further details.  Please advise.

## 2022-09-29 NOTE — Telephone Encounter (Signed)
Called pt back- sent in prednisone for mouth sores to [facility]

## 2022-11-19 ENCOUNTER — Encounter: Payer: Self-pay | Admitting: Family Medicine

## 2022-11-19 ENCOUNTER — Ambulatory Visit (INDEPENDENT_AMBULATORY_CARE_PROVIDER_SITE_OTHER): Payer: Commercial Managed Care - PPO | Admitting: Family Medicine

## 2022-11-19 VITALS — BP 110/68 | HR 91 | Ht 68.0 in | Wt 199.0 lb

## 2022-11-19 DIAGNOSIS — H6501 Acute serous otitis media, right ear: Secondary | ICD-10-CM | POA: Diagnosis not present

## 2022-11-19 DIAGNOSIS — J302 Other seasonal allergic rhinitis: Secondary | ICD-10-CM

## 2022-11-19 DIAGNOSIS — H8111 Benign paroxysmal vertigo, right ear: Secondary | ICD-10-CM | POA: Diagnosis not present

## 2022-11-19 DIAGNOSIS — R42 Dizziness and giddiness: Secondary | ICD-10-CM

## 2022-11-19 MED ORDER — MECLIZINE HCL 25 MG PO TABS: 25.0000 mg | ORAL_TABLET | Freq: Three times a day (TID) | ORAL | 1 refills | Status: AC | PRN

## 2022-11-19 NOTE — Progress Notes (Signed)
Date:  11/19/2022   Name:  Dustin Morse.   DOB:  [birth year]   MRN:  [id number]   Chief Complaint: Dizziness (Room spinning - started Tuesday)  Dizziness This is a recurrent problem. Episode onset: this episode/Monday. The problem occurs intermittently. The problem has been waxing and waning. Associated symptoms include headaches, myalgias, nausea, vertigo and a visual change. Pertinent negatives include no chest pain, congestion, rash, sore throat or vomiting. The symptoms are aggravated by twisting (quick head movement). Treatments tried: [employer]. The treatment provided mild relief.  Otalgia  There is pain in the right ear. The current episode started in the past 7 days. The pain is mild. Associated symptoms include headaches. Pertinent negatives include no ear discharge, hearing loss, rash, rhinorrhea, sore throat or vomiting. Associated symptoms comments: No tinnitis. He has tried acetaminophen (expired meclizine) for the symptoms.    No results found for: "NA", "K", "CO2", "GLUCOSE", "BUN", "CREATININE", "CALCIUM", "EGFR", "GFRNONAA" No results found for: "CHOL", "HDL", "LDLCALC", "LDLDIRECT", "TRIG", "CHOLHDL" No results found for: "TSH" No results found for: "HGBA1C" Lab Results  Component Value Date   HGB 15.1 11/16/2017   No results found for: "ALT", "AST", "GGT", "ALKPHOS", "BILITOT" No results found for: "25OHVITD2", "25OHVITD3", "VD25OH"   Review of Systems  HENT:  Positive for ear pain and sinus pressure. Negative for congestion, ear discharge, hearing loss, nosebleeds, postnasal drip, rhinorrhea, sore throat and tinnitus.   Eyes:  Positive for visual disturbance.  Respiratory:  Negative for chest tightness, shortness of breath, wheezing and stridor.   Cardiovascular:  Negative for chest pain, palpitations and leg swelling.  Gastrointestinal:  Positive for nausea. Negative for vomiting.  Musculoskeletal:  Positive for myalgias.  Skin:  Negative for rash.   Neurological:  Positive for dizziness, vertigo and headaches.    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  Current Meds  Medication Sig   acetaminophen (TYLENOL) 500 MG tablet Take 500 mg by mouth every 6 (six) hours as needed.   aspirin 81 MG EC tablet Take by mouth.   fluticasone (FLONASE) 50 MCG/ACT nasal spray Place 2 sprays into both nostrils daily.   meclizine (ANTIVERT) 25 MG tablet Take 1 tablet (25 mg total) by mouth 3 (three) times daily as needed for dizziness.   Multiple Vitamin (ONE-A-DAY MENS PO) Take 1 tablet by mouth daily.   pantoprazole (PROTONIX) 40 MG tablet Take 1 tablet (40 mg total) by mouth daily.   predniSONE (DELTASONE) 10 MG tablet Take 1 tablet (10 mg total) by mouth daily as needed (cold sores).   saw palmetto 500 MG capsule Take 500 mg by mouth daily.       11/19/2022    3:34 PM 08/28/2022   11:00 AM 06/23/2021   11:33 AM 03/21/2021    8:20 AM  GAD 7 : Generalized Anxiety Score  Nervous, Anxious, on Edge 0 0 0  0  Control/stop worrying 0 0 0 0  Worry too much - different things 0 0 0 0  Trouble relaxing 0 0 0 0  Restless 0 0 0 0  Easily annoyed or irritable 0 0 0 0  Afraid - awful might happen 0 0 0 0  Total GAD 7 Score 0 0 0 0  Anxiety Difficulty Not difficult at all Not difficult at all Not difficult at all        11/19/2022    3:34 PM 08/28/2022   10:59 AM 06/23/2021   11:32 AM  Depression screen PHQ 2/9  Decreased Interest 0 0 0  Down, Depressed,  Hopeless 0 0 0  PHQ - 2 Score 0 0 0  Altered sleeping 0 0 0  Tired, decreased energy 0 0 0  Change in appetite 0 0 0  Feeling bad or failure about yourself  0 0 0  Trouble concentrating 0 0 0  Moving slowly or fidgety/restless 0 0 0  Suicidal thoughts 0 0 0  PHQ-9 Score 0 0 0  Difficult doing work/chores Not difficult at all Not difficult at all     BP Readings from Last 3 Encounters:  11/19/22 110/68  08/28/22 118/78  02/13/22 (!) 112/90    Physical Exam Vitals and nursing note reviewed.  HENT:     Head: Normocephalic.     Right Ear: Hearing, ear canal and external ear normal. A middle ear effusion is present.     Left Ear: Hearing, tympanic membrane, ear canal and external ear normal.     Nose: Nose normal.     Mouth/Throat:     Mouth: Mucous membranes are moist.  Eyes:     General: No scleral icterus.       Right eye: No discharge.        Left eye: No discharge.     Conjunctiva/sclera: Conjunctivae normal.     Pupils: Pupils are equal, round, and reactive to light.  Neck:     Thyroid: No thyromegaly.     Vascular: No JVD.     Trachea: No tracheal deviation.  Cardiovascular:     Rate and Rhythm: Normal rate and regular rhythm.     Heart sounds: Normal heart sounds. No murmur heard.    No friction rub. No gallop.  Pulmonary:     Effort: No respiratory distress.     Breath sounds: Normal breath sounds. No wheezing or rales.  Abdominal:     General: Bowel sounds are normal.     Palpations: Abdomen is soft. There is no mass.     Tenderness: There is no abdominal tenderness. There is no guarding or rebound.  Musculoskeletal:        General: No tenderness. Normal range of motion.     Cervical back: Normal range of motion and neck supple.  Lymphadenopathy:     Cervical: No cervical adenopathy.  Skin:    General: Skin is warm.     Findings: No rash.  Neurological:     Mental Status: He is alert and oriented to person, place, and time.     Cranial Nerves: No  cranial nerve deficit.     Deep Tendon Reflexes: Reflexes are normal and symmetric.     Wt Readings from Last 3 Encounters:  11/19/22 199 lb (90.3 kg)  08/28/22 191 lb (86.6 kg)  02/13/22 198 lb (89.8 kg)    BP 110/68   Pulse 91   Ht 5\' 8"  (1.727 m)  Wt 199 lb (90.3 kg)   SpO2 97%   BMI 30.26 kg/m   Assessment and Plan:  1. Vertigo Chronic.  Controlled.  Stable.  Patient has had recurrent episodes of vertigo and in the past has been controlled with occasional meclizine 25 mg.  Current bottle in possession is out of date and we will refill and update his meclizine. - meclizine (ANTIVERT) 25 MG tablet; Take 1 tablet (25 mg total) by mouth 3 (three) times daily as needed for dizziness.  Dispense: 30 tablet; Refill: 1  2. Benign paroxysmal positional vertigo of right ear As noted above patient has periodic flareups which is probably coinciding with pollen counts being elevated particularly in this case ragweed. - meclizine (ANTIVERT) 25 MG tablet; Take 1 tablet (25 mg total) by mouth 3 (three) times daily as needed for dizziness.  Dispense: 30 tablet; Refill: 1  3. Non-recurrent acute serous otitis media of right ear On evaluation there is slight fluid noted behind the right tympanic membrane.  No erythema and no significant bulging to suggest that is an early otitis serous and we will encourage Sudafed nasal steroid.  4. Seasonal allergic rhinitis, unspecified trigger Chronic.  Seasonal.  Stable.  Continue Flonase and Sudafed needs to be resumed which the patient has stopped earlier in the year.   Elizabeth Sauer, MD

## 2023-01-09 ENCOUNTER — Other Ambulatory Visit: Payer: Self-pay | Admitting: Family Medicine

## 2023-01-09 DIAGNOSIS — K219 Gastro-esophageal reflux disease without esophagitis: Secondary | ICD-10-CM

## 2023-05-12 ENCOUNTER — Other Ambulatory Visit: Payer: Self-pay | Admitting: Family Medicine

## 2023-05-12 ENCOUNTER — Telehealth: Payer: Self-pay | Admitting: Family Medicine

## 2023-05-12 DIAGNOSIS — K219 Gastro-esophageal reflux disease without esophagitis: Secondary | ICD-10-CM

## 2023-05-12 NOTE — Telephone Encounter (Signed)
 Pharmacy requested refill today in a separate encounter.

## 2023-05-12 NOTE — Telephone Encounter (Signed)
 Medication Refill -  Most Recent Primary Care Visit:  Provider: JOSHUA CATHRYNE BROCKS  Department: [facility]  Visit Type: OFFICE VISIT  Date: 11/19/2022  Medication: pantoprazole  (PROTONIX ) 40 MG tablet   Has the patient contacted their pharmacy? Yes (Agent: If no, request that the patient contact the pharmacy for the refill. If patient does not wish to contact the pharmacy document the reason why and proceed with request.) (Agent: If yes, when and what did the pharmacy advise?)  Is this the correct pharmacy for this prescription? Yes If no, delete pharmacy and type the correct one.   [facility] - [place], [place] - [address]  [address] [place] [address]  Phone: [phone] Fax: [phone]    Has the prescription been filled recently? Yes  Is the patient out of the medication? Yes  Has the patient been seen for an appointment in the last year OR does the patient have an upcoming appointment? Yes  Can we respond through MyChart? Yes  Agent: Please be advised that Rx refills may take up to 3 business days. We ask that you follow-up with your pharmacy.

## 2023-07-01 ENCOUNTER — Encounter: Payer: Self-pay | Admitting: Family Medicine

## 2023-07-01 ENCOUNTER — Ambulatory Visit (INDEPENDENT_AMBULATORY_CARE_PROVIDER_SITE_OTHER): Admitting: Family Medicine

## 2023-07-01 VITALS — BP 110/76 | HR 100 | Temp 98.0°F | Ht 68.0 in | Wt 198.0 lb

## 2023-07-01 DIAGNOSIS — J0100 Acute maxillary sinusitis, unspecified: Secondary | ICD-10-CM

## 2023-07-01 MED ORDER — AZITHROMYCIN 250 MG PO TABS
ORAL_TABLET | ORAL | 0 refills | Status: AC
Start: 2023-07-01 — End: 2023-07-06

## 2023-07-01 NOTE — Progress Notes (Signed)
 Date:  07/01/2023   Name:  Dustin Morse.   DOB:  June 05, 1975   MRN:  [id number]   Chief Complaint: Cough (X 2 days, congestion, getting worse, history of sinus infections, runny nose, sneezing, sinus pressure sore throat, no fever, tried allegra did not help, no sick contacts )  Sinusitis This is a chronic problem. The current episode started more than 1 year ago. The problem has been gradually improving since onset. There has been no fever. The pain is mild. Associated symptoms include congestion, coughing, ear pain, headaches, sinus pressure, sneezing and a sore throat. Pertinent negatives include no chills, diaphoresis, hoarse voice, neck pain, shortness of breath or swollen glands. Treatments tried: alllegra. The treatment provided mild relief.    No results found for: "NA", "K", "CO2", "GLUCOSE", "BUN", "CREATININE", "CALCIUM", "EGFR", "GFRNONAA" No results found for: "CHOL", "HDL", "LDLCALC", "LDLDIRECT", "TRIG", "CHOLHDL" No results found for: "TSH" No results found for: "HGBA1C" Lab Results  Component Value Date   HGB 15.1 11/16/2017   No results found for: "ALT", "AST", "GGT", "ALKPHOS", "BILITOT" No results found for: "25OHVITD2", "25OHVITD3", "VD25OH"   Review of Systems  Constitutional:  Negative for chills, diaphoresis and fever.  HENT:  Positive for congestion, ear pain, sinus pressure, sneezing and sore throat. Negative for ear discharge, hoarse voice, mouth sores, nosebleeds, postnasal drip, rhinorrhea, sinus pain and trouble swallowing.   Eyes:  Negative for visual disturbance.  Respiratory:  Positive for cough. Negative for chest tightness, shortness of breath and wheezing.   Cardiovascular:  Negative for chest pain, palpitations and leg swelling.  Gastrointestinal:  Negative for abdominal distention, anal bleeding and blood in stool.  Endocrine: Negative for polydipsia and polyuria.  Genitourinary:  Negative for difficulty urinating.  Musculoskeletal:   Negative for neck pain.  Neurological:  Positive for headaches.    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  Current Meds  Medication Sig   acetaminophen (TYLENOL) 500 MG tablet Take 500 mg by mouth every 6 (six) hours as needed.   aspirin 81 MG EC tablet Take by mouth.   fluticasone (FLONASE) 50 MCG/ACT nasal spray Place 2 sprays into both nostrils daily.   meclizine (ANTIVERT) 25 MG tablet Take 1 tablet (25 mg total) by mouth 3 (three) times daily as needed for dizziness.   Multiple Vitamin (ONE-A-DAY MENS PO) Take 1 tablet by mouth daily.   naproxen sodium (ALEVE) 220 MG tablet Take 220 mg by mouth.   pantoprazole (PROTONIX) 40 MG tablet TAKE ONE (1) TABLET (40 MG TOTAL) BY MOUTH DAILY.   predniSONE (DELTASONE) 10 MG tablet Take 1 tablet (10 mg total) by mouth daily as needed (cold sores).   pseudoephedrine (SUDAFED) 120 MG 12 hr tablet Take 120 mg by mouth daily.   saw palmetto 500 MG capsule Take 500 mg by mouth daily.       07/01/2023   11:04  AM 11/19/2022    3:34 PM 08/28/2022   11:00 AM 06/23/2021   11:33 AM  GAD 7 : Generalized Anxiety Score  Nervous, Anxious, on Edge 0 0 0 0  Control/stop worrying 0 0 0 0  Worry too much - different things 0 0 0 0  Trouble relaxing 0 0 0 0  Restless 0 0 0 0  Easily annoyed or irritable 0 0 0 0  Afraid - awful might happen 0 0 0 0  Total GAD 7 Score 0 0 0 0  Anxiety Difficulty Not difficult at all Not difficult at all Not difficult at all Not  difficult at all       07/01/2023   11:04 AM 11/19/2022    3:34 PM 08/28/2022   10:59 AM  Depression screen PHQ 2/9  Decreased Interest 0 0 0  Down, Depressed, Hopeless 0 0 0  PHQ - 2 Score 0 0 0  Altered sleeping  0 0  Tired, decreased energy  0 0  Change in appetite  0 0  Feeling bad or failure about yourself   0 0  Trouble concentrating  0 0  Moving slowly or fidgety/restless  0 0  Suicidal thoughts  0 0  PHQ-9 Score  0 0  Difficult doing work/chores  Not difficult at all Not difficult at all    BP Readings from Last 3 Encounters:  07/01/23 110/76  11/19/22 110/68  08/28/22 118/78    Physical Exam Vitals and nursing note reviewed.  Constitutional:      Appearance: He is well-developed.  HENT:     Head: Normocephalic and atraumatic.     Right Ear: Hearing, tympanic membrane, ear canal and external ear normal. There is no impacted cerumen.     Left Ear: Hearing, tympanic membrane, ear canal and external ear normal. There is no impacted cerumen.     Nose: No congestion or rhinorrhea.     Right Sinus: Maxillary sinus tenderness and frontal sinus tenderness present.     Left Sinus: Maxillary sinus tenderness and frontal sinus tenderness present.     Mouth/Throat:     Mouth: Mucous membranes are moist.     Dentition: Normal dentition.     Pharynx: No oropharyngeal exudate or posterior oropharyngeal erythema.  Eyes:     General: Lids are normal. No scleral icterus.    Conjunctiva/sclera: Conjunctivae normal.     Pupils: Pupils are equal, round, and reactive to light.  Neck:     Thyroid: No thyromegaly.     Vascular: No carotid bruit, hepatojugular reflux or JVD.     Trachea: No tracheal deviation.  Cardiovascular:     Rate and Rhythm: Normal rate and regular rhythm.     Heart sounds: Normal heart sounds. No murmur heard.    No gallop.  Pulmonary:     Effort: Pulmonary effort is normal. No respiratory distress.     Breath sounds: Normal breath sounds. No stridor. No  wheezing, rhonchi or rales.  Chest:     Chest wall: No tenderness.  Abdominal:     Palpations: Abdomen is soft. There is no hepatomegaly or splenomegaly.     Hernia: There is no hernia in the left inguinal area.  Musculoskeletal:        General: Normal range of motion.     Cervical back: Normal range of motion and neck supple.  Lymphadenopathy:     Cervical: No cervical adenopathy.  Skin:    General: Skin is warm and dry.  Findings: No rash.  Neurological:     Mental Status: He is alert and oriented to person, place, and time.     Sensory: No sensory deficit.     Deep Tendon Reflexes: Reflexes are normal and symmetric.  Psychiatric:        Mood and Affect: Mood is not anxious or depressed.     Wt Readings from Last 3 Encounters:  07/01/23 198 lb (89.8 kg)  11/19/22 199 lb (90.3 kg)  08/28/22 191 lb (86.6 kg)    BP 110/76   Pulse 100   Temp 98 F (36.7 C)   Ht 5\' 8"  (1.727 m)   Wt 198 lb (89.8 kg)   SpO2 97%   BMI 30.11 kg/m   Assessment and Plan: 1. Acute maxillary sinusitis, recurrence not specified (Primary) No onset.  Persistent.  Stable.  Patient does not have symptomatic relief with over-the-counter nasal steroids/Nettie pot/antihistamines.  Patient generally does well on azithromycin 2 tablets today followed by 1 a day for 4 days.  Will recheck patient on as-needed basis. - azithromycin (ZITHROMAX) 250 MG tablet; Take 2 tablets on day 1, then 1 tablet daily on days 2 through 5  Dispense: 6 tablet; Refill: 0     Elizabeth Sauer, MD

## 2023-11-04 LAB — COLOGUARD: COLOGUARD: POSITIVE — AB

## 2024-05-04 ENCOUNTER — Other Ambulatory Visit: Payer: Self-pay

## 2024-05-04 ENCOUNTER — Encounter
Admission: RE | Admit: 2024-05-04 | Discharge: 2024-05-04 | Disposition: A | Discharge status: Home / Self Care | Source: Ambulatory Visit | Attending: Surgery | Admitting: Surgery

## 2024-05-04 HISTORY — DX: Malignant (primary) neoplasm, unspecified: C80.1

## 2024-05-04 HISTORY — DX: Gastro-esophageal reflux disease without esophagitis: K21.9

## 2024-05-04 HISTORY — DX: Pneumonia, unspecified organism: J18.9

## 2024-05-04 HISTORY — DX: Unilateral inguinal hernia, without obstruction or gangrene, not specified as recurrent: K40.90

## 2024-05-04 MED ORDER — ORAL CARE MOUTH RINSE: 15.0000 mL | Freq: Once | OROMUCOSAL | Status: AC

## 2024-05-04 MED ORDER — CHLORHEXIDINE GLUCONATE 0.12 % MT SOLN
15.0000 mL | Freq: Once | OROMUCOSAL | Status: AC
  Administered 2024-05-05: 15 mL via OROMUCOSAL

## 2024-05-04 MED ORDER — LACTATED RINGERS IV SOLN: INTRAVENOUS | Status: DC

## 2024-05-04 NOTE — Patient Instructions (Addendum)
 Your procedure is scheduled on: 05/05/24  Report to the Registration Desk on the 1st floor of the [facility]. To find out your arrival time, please call [phone] between 1PM - 3PM on: 05/04/24 If your arrival time is 6:00 am, do not arrive before that time as the [facility] entrance doors do not open until 6:00 am.  REMEMBER: Instructions that are not followed completely may result in serious medical risk, up to and including death; or upon the discretion of your surgeon and anesthesiologist your surgery may need to be rescheduled.  Do not eat food after midnight the night before surgery.  No gum chewing or hard candies.  You may however, drink CLEAR liquids up to 2 hours before you are scheduled to arrive for your surgery. Do not drink anything within 2 hours of your scheduled arrival time.  Clear liquids include: - water  - apple juice without pulp - gatorade (not RED colors) - black coffee or tea (Do NOT add milk or creamers to the coffee or tea) Do NOT drink anything that is not on this list.  One week prior to surgery: Stop Anti-inflammatories (NSAIDS) such as Advil, Aleve, Ibuprofen, Motrin, Naproxen, Naprosyn and Aspirin  based products such as Excedrin, Goody's Powder, BC Powder. You may continue to take Tylenol if needed for pain up until the day of surgery.  Stop ANY OVER THE COUNTER supplements until after surgery.  ON THE DAY OF SURGERY ONLY TAKE THESE MEDICATIONS WITH SIPS OF WATER:  omeprazole (PRILOSEC OTC)    No Alcohol for 24 hours before or after surgery.  No Smoking including e-cigarettes for 24 hours before surgery.  No chewable tobacco products for at least 6 hours before surgery.  No nicotine patches on the day of surgery.  Do not use any recreational drugs for at least a week (preferably 2 weeks) before your surgery.  Please be advised that the combination of cocaine and anesthesia may have negative outcomes, up to and including death. If you  test positive for cocaine, your surgery will be cancelled.  On the morning of surgery brush your teeth with toothpaste and water, you may rinse your mouth with mouthwash if you wish. Do not swallow any toothpaste or mouthwash.  Do not wear jewelry, make-up, hairpins, clips or nail polish.  For welded (permanent) jewelry: bracelets, anklets, waist bands, etc.  Please have this removed prior to surgery.  If it is not removed, there is a chance that hospital personnel will need to cut it off on the day of surgery.  Do not wear lotions, powders, or perfumes.   Do not shave body hair from the neck down 48 hours before surgery.  Contact lenses, hearing aids and dentures may not be worn into surgery.  Do not bring valuables to the hospital. [facility] is not responsible for any missing/lost belongings or valuables.   Notify your doctor if there is any change in your medical condition (cold, fever, infection).  Wear comfortable clothing (specific to your surgery type) to the hospital.  After surgery, you can help prevent lung complications by doing breathing exercises.  Take deep breaths and cough every 1-2 hours. Your doctor may order a device called an Incentive Spirometer to help you take deep breaths.  When coughing or sneezing, hold a pillow firmly against your incision with both hands. This is called splinting. Doing this helps protect your incision. It also decreases belly discomfort.  If you are being admitted to the hospital overnight, leave  your suitcase in the car. After surgery it may be brought to your room.  In case of increased patient census, it may be necessary for you, the patient, to continue your postoperative care in the Same Day Surgery department.  If you are being discharged the day of surgery, you will not be allowed to drive home. You will need a responsible individual to drive you home and stay with you for 24 hours after surgery.   If you are taking public  transportation, you will need to have a responsible individual with you.  Please call the Pre-admissions Testing Dept. at [phone] if you have any questions about these instructions.  Surgery Visitation Policy:  Patients having surgery or a procedure may have two visitors.  Children under the age of 88 must have an adult with them who is not the patient.  Inpatient Visitation:    Visiting hours are 7 a.m. to 8 p.m. Up to four visitors are allowed at one time in a patient room. The visitors may rotate out with other people during the day.  One visitor age 38 or older may stay with the patient overnight and must be in the room by 8 p.m.   Merchandiser, Retail to address health-related social needs:  [email or web address]

## 2024-05-05 ENCOUNTER — Encounter
Admission: RE | Disposition: A | Discharge status: Home / Self Care | Payer: Self-pay | Source: Home / Self Care | Attending: Surgery

## 2024-05-05 ENCOUNTER — Ambulatory Visit: Admitting: Certified Registered"

## 2024-05-05 ENCOUNTER — Encounter: Payer: Self-pay | Admitting: Surgery

## 2024-05-05 ENCOUNTER — Ambulatory Visit: Payer: Self-pay | Admitting: Surgery

## 2024-05-05 ENCOUNTER — Other Ambulatory Visit: Payer: Self-pay

## 2024-05-05 ENCOUNTER — Ambulatory Visit
Admission: RE | Admit: 2024-05-05 | Discharge: 2024-05-05 | Disposition: A | Discharge status: Home / Self Care | Attending: Surgery | Admitting: Surgery

## 2024-05-05 DIAGNOSIS — E782 Mixed hyperlipidemia: Secondary | ICD-10-CM | POA: Diagnosis not present

## 2024-05-05 DIAGNOSIS — K4090 Unilateral inguinal hernia, without obstruction or gangrene, not specified as recurrent: Secondary | ICD-10-CM | POA: Diagnosis present

## 2024-05-05 DIAGNOSIS — K219 Gastro-esophageal reflux disease without esophagitis: Secondary | ICD-10-CM | POA: Diagnosis not present

## 2024-05-05 MED ORDER — DEXMEDETOMIDINE HCL IN NACL 80 MCG/20ML IV SOLN
INTRAVENOUS | Status: DC | PRN
  Administered 2024-05-05 (×2): 10 ug via INTRAVENOUS

## 2024-05-05 MED ORDER — SUGAMMADEX SODIUM 200 MG/2ML IV SOLN
INTRAVENOUS | Status: DC | PRN
  Administered 2024-05-05: 200 mg via INTRAVENOUS

## 2024-05-05 MED ORDER — OXYCODONE HCL 5 MG PO TABS
ORAL_TABLET | ORAL | Status: AC
  Filled 2024-05-05: qty 1

## 2024-05-05 MED ORDER — MIDAZOLAM HCL (PF) 2 MG/2ML IJ SOLN
INTRAMUSCULAR | Status: DC | PRN
  Administered 2024-05-05: 2 mg via INTRAVENOUS

## 2024-05-05 MED ORDER — CEFAZOLIN SODIUM-DEXTROSE 2-4 GM/100ML-% IV SOLN
2.0000 g | INTRAVENOUS | Status: AC
  Administered 2024-05-05: 2 g via INTRAVENOUS

## 2024-05-05 MED ORDER — HYDROMORPHONE HCL 1 MG/ML IJ SOLN
INTRAMUSCULAR | Status: AC
  Filled 2024-05-05: qty 1

## 2024-05-05 MED ORDER — CHLORHEXIDINE GLUCONATE 0.12 % MT SOLN
OROMUCOSAL | Status: AC
  Filled 2024-05-05: qty 15

## 2024-05-05 MED ORDER — PROPOFOL 10 MG/ML IV BOLUS
INTRAVENOUS | Status: DC | PRN
  Administered 2024-05-05 (×3): 50 mg via INTRAVENOUS
  Administered 2024-05-05: 150 mg via INTRAVENOUS

## 2024-05-05 MED ORDER — HYDROMORPHONE HCL 1 MG/ML IJ SOLN: 0.2500 mg | INTRAMUSCULAR | Status: DC | PRN

## 2024-05-05 MED ORDER — MIDAZOLAM HCL 2 MG/2ML IJ SOLN
INTRAMUSCULAR | Status: AC
  Filled 2024-05-05: qty 2

## 2024-05-05 MED ORDER — BUPIVACAINE-EPINEPHRINE (PF) 0.5% -1:200000 IJ SOLN
INTRAMUSCULAR | Status: AC
  Filled 2024-05-05: qty 30

## 2024-05-05 MED ORDER — IBUPROFEN 800 MG PO TABS
800.0000 mg | ORAL_TABLET | Freq: Three times a day (TID) | ORAL | 0 refills | Status: AC | PRN
  Filled 2024-05-05: qty 30, 10d supply, fill #0

## 2024-05-05 MED ORDER — DEXAMETHASONE SOD PHOSPHATE PF 10 MG/ML IJ SOLN
INTRAMUSCULAR | Status: DC | PRN
  Administered 2024-05-05: 10 mg via INTRAVENOUS

## 2024-05-05 MED ORDER — CELECOXIB 200 MG PO CAPS
200.0000 mg | ORAL_CAPSULE | ORAL | Status: AC
  Administered 2024-05-05: 200 mg via ORAL

## 2024-05-05 MED ORDER — LIDOCAINE HCL (CARDIAC) PF 100 MG/5ML IV SOSY
PREFILLED_SYRINGE | INTRAVENOUS | Status: DC | PRN
  Administered 2024-05-05: 100 mg via INTRAVENOUS

## 2024-05-05 MED ORDER — ROCURONIUM BROMIDE 100 MG/10ML IV SOLN
INTRAVENOUS | Status: DC | PRN
  Administered 2024-05-05: 10 mg via INTRAVENOUS
  Administered 2024-05-05: 40 mg via INTRAVENOUS

## 2024-05-05 MED ORDER — BUPIVACAINE-EPINEPHRINE 0.5% -1:200000 IJ SOLN
INTRAMUSCULAR | Status: DC | PRN
  Administered 2024-05-05: 30 mL

## 2024-05-05 MED ORDER — LIDOCAINE HCL (PF) 2 % IJ SOLN
INTRAMUSCULAR | Status: AC
  Filled 2024-05-05: qty 5

## 2024-05-05 MED ORDER — ONDANSETRON HCL 4 MG/2ML IJ SOLN
INTRAMUSCULAR | Status: DC | PRN
  Administered 2024-05-05: 4 mg via INTRAVENOUS

## 2024-05-05 MED ORDER — OXYCODONE HCL 5 MG/5ML PO SOLN: 5.0000 mg | Freq: Once | ORAL | Status: AC | PRN

## 2024-05-05 MED ORDER — OXYCODONE HCL 5 MG PO TABS
5.0000 mg | ORAL_TABLET | Freq: Once | ORAL | Status: AC | PRN
  Administered 2024-05-05: 5 mg via ORAL

## 2024-05-05 MED ORDER — ACETAMINOPHEN 500 MG PO TABS
1000.0000 mg | ORAL_TABLET | ORAL | Status: AC
  Administered 2024-05-05: 1000 mg via ORAL

## 2024-05-05 MED ORDER — CELECOXIB 200 MG PO CAPS
ORAL_CAPSULE | ORAL | Status: AC
  Filled 2024-05-05: qty 1

## 2024-05-05 MED ORDER — ROCURONIUM BROMIDE 10 MG/ML (PF) SYRINGE
PREFILLED_SYRINGE | INTRAVENOUS | Status: AC
  Filled 2024-05-05: qty 10

## 2024-05-05 MED ORDER — ACETAMINOPHEN 500 MG PO TABS
ORAL_TABLET | ORAL | Status: AC
  Filled 2024-05-05: qty 2

## 2024-05-05 MED ORDER — CHLORHEXIDINE GLUCONATE CLOTH 2 % EX PADS
6.0000 | MEDICATED_PAD | Freq: Once | CUTANEOUS | Status: AC
  Administered 2024-05-05: 6 via TOPICAL

## 2024-05-05 MED ORDER — FENTANYL CITRATE (PF) 100 MCG/2ML IJ SOLN
INTRAMUSCULAR | Status: DC | PRN
  Administered 2024-05-05 (×2): 50 ug via INTRAVENOUS

## 2024-05-05 MED ORDER — PHENYLEPHRINE 80 MCG/ML (10ML) SYRINGE FOR IV PUSH (FOR BLOOD PRESSURE SUPPORT)
PREFILLED_SYRINGE | INTRAVENOUS | Status: DC | PRN
  Administered 2024-05-05: 40 ug via INTRAVENOUS
  Administered 2024-05-05: 120 ug via INTRAVENOUS

## 2024-05-05 MED ORDER — OXYCODONE-ACETAMINOPHEN 5-325 MG PO TABS
1.0000 | ORAL_TABLET | Freq: Three times a day (TID) | ORAL | 0 refills | Status: AC | PRN
  Filled 2024-05-05: qty 6, 2d supply, fill #0

## 2024-05-05 MED ORDER — DOCUSATE SODIUM 100 MG PO CAPS
100.0000 mg | ORAL_CAPSULE | Freq: Two times a day (BID) | ORAL | 0 refills | Status: AC | PRN
  Filled 2024-05-05: qty 20, 10d supply, fill #0

## 2024-05-05 MED ORDER — GABAPENTIN 300 MG PO CAPS
ORAL_CAPSULE | ORAL | Status: AC
  Filled 2024-05-05: qty 1

## 2024-05-05 MED ORDER — KETOROLAC TROMETHAMINE 30 MG/ML IJ SOLN
INTRAMUSCULAR | Status: DC | PRN
  Administered 2024-05-05: 15 mg via INTRAVENOUS

## 2024-05-05 MED ORDER — GABAPENTIN 300 MG PO CAPS
300.0000 mg | ORAL_CAPSULE | ORAL | Status: AC
  Administered 2024-05-05: 300 mg via ORAL

## 2024-05-05 MED ORDER — FENTANYL CITRATE (PF) 100 MCG/2ML IJ SOLN
INTRAMUSCULAR | Status: AC
  Filled 2024-05-05: qty 2

## 2024-05-05 MED ORDER — ONDANSETRON HCL 4 MG/2ML IJ SOLN: 4.0000 mg | Freq: Once | INTRAMUSCULAR | Status: DC | PRN

## 2024-05-05 MED ORDER — CEFAZOLIN SODIUM-DEXTROSE 2-4 GM/100ML-% IV SOLN
INTRAVENOUS | Status: AC
  Filled 2024-05-05: qty 100

## 2024-05-05 MED ORDER — MEPERIDINE HCL 25 MG/ML IJ SOLN: 6.2500 mg | INTRAMUSCULAR | Status: DC | PRN

## 2024-05-05 MED ORDER — HYDROMORPHONE HCL 1 MG/ML IJ SOLN
INTRAMUSCULAR | Status: DC | PRN
  Administered 2024-05-05 (×2): .2 mg via INTRAVENOUS

## 2024-05-05 NOTE — Transfer of Care (Signed)
 Immediate Anesthesia Transfer of Care Note  Patient: Dustin Morse.  Procedure(s) Performed: REPAIR, HERNIA, INGUINAL, ROBOT-ASSISTED, LAPAROSCOPIC, USING MESH (Left: Groin)  Patient Location: PACU  Anesthesia Type:General  Level of Consciousness: awake, drowsy, and patient cooperative  Airway & Oxygen Therapy: Patient Spontanous Breathing and Patient connected to face mask oxygen  Post-op Assessment: Report given to RN, Post -op Vital signs reviewed and stable, and Patient moving all extremities X 4  Post vital signs: Reviewed and stable  Last Vitals:  Vitals Value Taken Time  BP    Temp    Pulse 85 05/05/24 13:07  Resp 22 05/05/24 13:07  SpO2 96 % 05/05/24 13:07  Vitals shown include unfiled device data.  Last Pain:  Vitals:   05/05/24 1032  TempSrc: Temporal  PainSc: 0-No pain         Complications: No notable events documented.

## 2024-05-05 NOTE — Interval H&P Note (Signed)
 No change. OK to proceed.

## 2024-05-05 NOTE — Anesthesia Procedure Notes (Signed)
 Procedure Name: Intubation Date/Time: 05/05/2024 11:20 AM  Performed by: Jestine Palma, CRNAPre-anesthesia Checklist: Patient identified, Emergency Drugs available, Suction available and Patient being monitored Patient Re-evaluated:Patient Re-evaluated prior to induction Oxygen Delivery Method: Circle system utilized Preoxygenation: Pre-oxygenation with 100% oxygen Induction Type: IV induction Ventilation: Mask ventilation without difficulty Laryngoscope Size: McGrath and 4 Grade View: Grade I Tube type: Oral Tube size: 7.5 mm Number of attempts: 1 Airway Equipment and Method: Stylet and Oral airway Placement Confirmation: ETT inserted through vocal cords under direct vision, positive ETCO2 and breath sounds checked- equal and bilateral Secured at: 22 cm Tube secured with: Tape Dental Injury: Teeth and Oropharynx as per pre-operative assessment  Comments: Atraumatic intubation

## 2024-05-05 NOTE — Anesthesia Preprocedure Evaluation (Addendum)
"                                    Anesthesia Evaluation  Patient identified by MRN, date of birth, ID band Patient awake    Reviewed: Allergy & Precautions, NPO status , Patient's Chart, lab work & pertinent test results, reviewed documented beta blocker date and time   Airway Mallampati: I  TM Distance: >3 FB Neck ROM: Full    Dental no notable dental hx.    Pulmonary  Recent sinus infection finished [facility] 03/2024 + still nasal congestion ( green/yellow) no fever/chills/cough Stopped Flonase    Pulmonary exam normal        Cardiovascular Exercise Tolerance: Good negative cardio ROS Normal cardiovascular exam  HLD diet controlled   Neuro/Psych    GI/Hepatic ,GERD  ,,  Endo/Other  HgbA1c- 6.4 diet controlled  Renal/GU      Musculoskeletal   Abdominal   Peds  Hematology   Anesthesia Other Findings Recurrent cold sores  Allergy Left inguinal hernia  Pneumonia GERD (gastroesophageal reflux disease) Cancer (HCC) ( basal cell nose/arm)  Labs 09/2023:  Sodium 135 - 145 mmol/L 141 Potassium 3.5 - 5.0 mmol/L 4.1 Chloride 98 - 108 mmol/L 104 Carbon Dioxide (CO2) 21 - 30 mmol/L 29 Urea Nitrogen (BUN) 7 - 20 mg/dL 26 High  Creatinine 0.6 - 1.3 mg/dL 1.2** Glucose 70 - 859 mg/dL 97 Comment: Interpretive Data: Above is the NONFASTING reference range.  Below are the FASTING reference ranges: NORMAL:      70-99 mg/dL PREDIABETES: 899-874 mg/dL DIABETES:    > 874 mg/dL Calcium 8.7 - 89.7 mg/dL 9.2 AST (Aspartate Aminotransferase) 15 - 41 U/L 22 ALT (Alanine Aminotransferase) 15 - 50 U/L 25 Bilirubin, Total 0.4 - 1.5 mg/dL 0.8 Alk Phos (Alkaline Phosphatase) 24 - 110 U/L 40 Albumin 3.5 - 4.8 g/dL 4.2 Protein, Total 6.2 - 8.1 g/dL 7.0 Anion Gap 3 - 12 mmol/L 8 BUN/CREA Ratio 6 - 27 21 Glomerular Filtration Rate (eGFR) mL/min/1.73sq m 74    Reproductive/Obstetrics                              Anesthesia  Physical Anesthesia Plan  ASA: 2  Anesthesia Plan: General   Post-op Pain Management:    Induction: Intravenous  PONV Risk Score and Plan: Ondansetron  and Dexamethasone   Airway Management Planned: Oral ETT  Additional Equipment:   Intra-op Plan:   Post-operative Plan: Extubation in OR  Informed Consent: I have reviewed the patients History and Physical, chart, labs and discussed the procedure including the risks, benefits and alternatives for the proposed anesthesia with the patient or authorized representative who has indicated his/her understanding and acceptance.       Plan Discussed with:   Anesthesia Plan Comments:          Anesthesia Quick Evaluation  "

## 2024-05-05 NOTE — Discharge Instructions (Signed)

## 2024-05-08 ENCOUNTER — Encounter: Payer: Self-pay | Admitting: Surgery
